# Patient Record
Sex: Female | Born: 1965
Health system: Southern US, Community
[De-identification: ages and names within clinical notes are randomized; demographics above are authoritative.]

## PROBLEM LIST (undated history)

## (undated) DIAGNOSIS — Z98891 History of uterine scar from previous surgery: Secondary | ICD-10-CM

## (undated) DIAGNOSIS — Z9889 Other specified postprocedural states: Secondary | ICD-10-CM

## (undated) DIAGNOSIS — N62 Hypertrophy of breast: Secondary | ICD-10-CM

## (undated) HISTORY — DX: Other specified postprocedural states: Z98.890

## (undated) HISTORY — PX: WISDOM TOOTH EXTRACTION: SHX21

---

## 2004-05-08 ENCOUNTER — Other Ambulatory Visit: Payer: Self-pay

## 2006-10-06 ENCOUNTER — Encounter: Payer: Self-pay | Admitting: Maternal & Fetal Medicine

## 2006-11-24 ENCOUNTER — Encounter: Payer: Self-pay | Admitting: Maternal & Fetal Medicine

## 2007-02-18 ENCOUNTER — Observation Stay: Payer: Self-pay | Admitting: Obstetrics and Gynecology

## 2007-03-19 ENCOUNTER — Observation Stay: Payer: Self-pay | Admitting: Obstetrics and Gynecology

## 2007-03-26 ENCOUNTER — Observation Stay: Payer: Self-pay | Admitting: Obstetrics and Gynecology

## 2007-04-01 ENCOUNTER — Inpatient Hospital Stay: Payer: Self-pay | Admitting: Obstetrics and Gynecology

## 2007-06-02 ENCOUNTER — Ambulatory Visit: Payer: Self-pay | Admitting: Obstetrics and Gynecology

## 2007-08-02 ENCOUNTER — Emergency Department: Payer: Self-pay

## 2008-01-06 IMAGING — US US PELV - US TRANSVAGINAL
1 series · 17 of 25 positions shown · non-contrast
Comparison: none

REASON FOR EXAM: enlarged uterus hydronephrosis lt side
COMMENTS:

[Series 1: us pelv - us transvaginal · 17 of 100 slices shown]
[im 1/100]
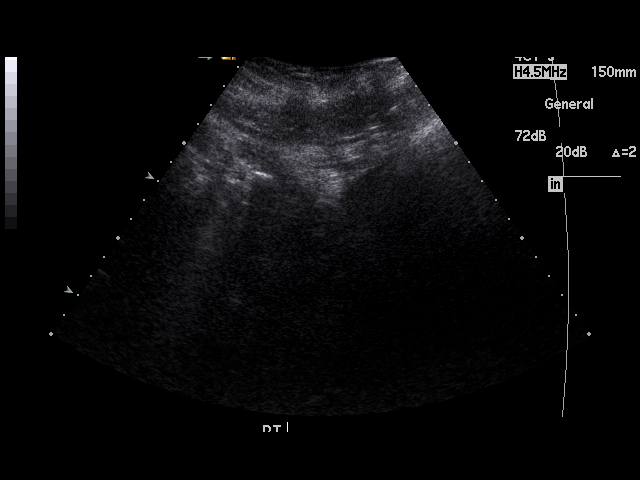
[im 9/100]
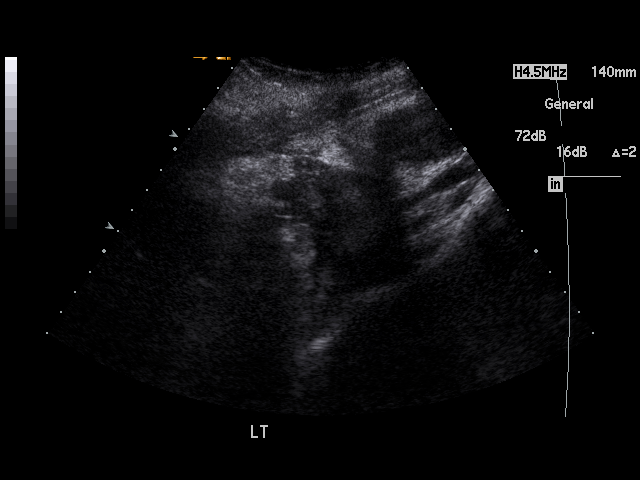
[im 13/100]
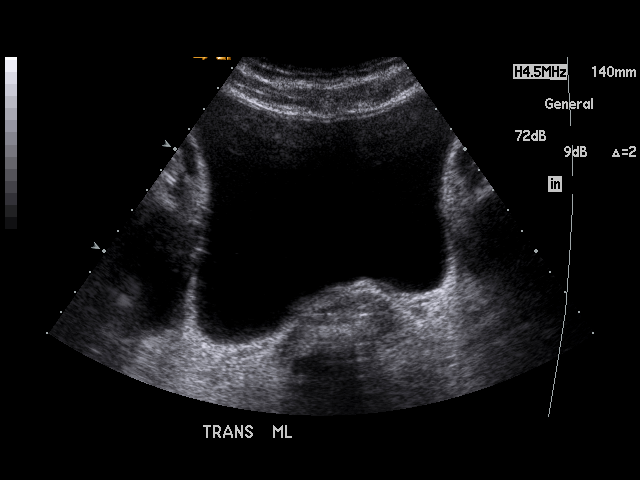
[im 21/100]
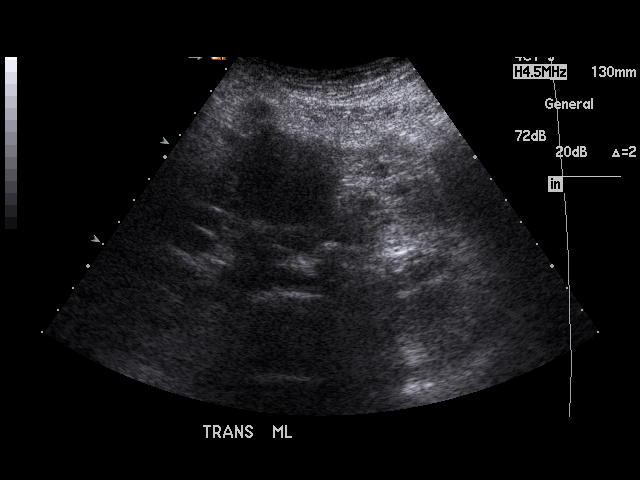
[im 25/100]
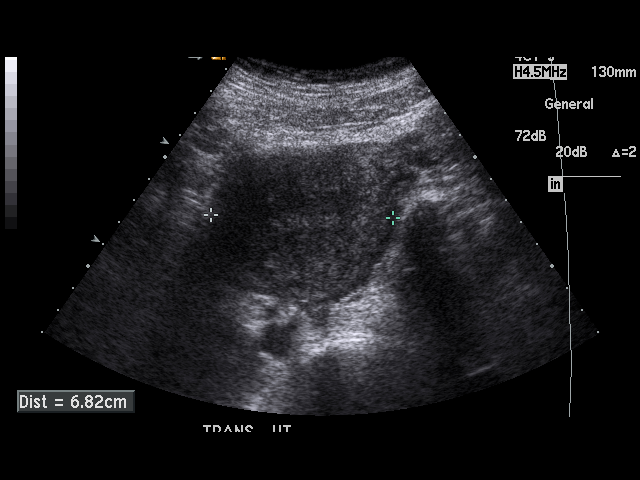
[im 34/100]
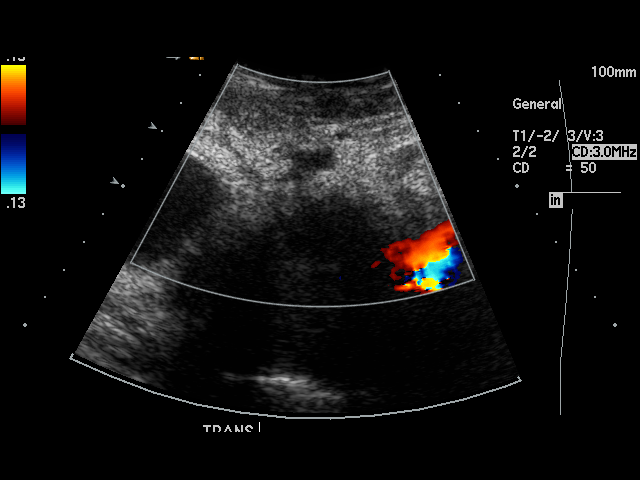
[im 38/100]
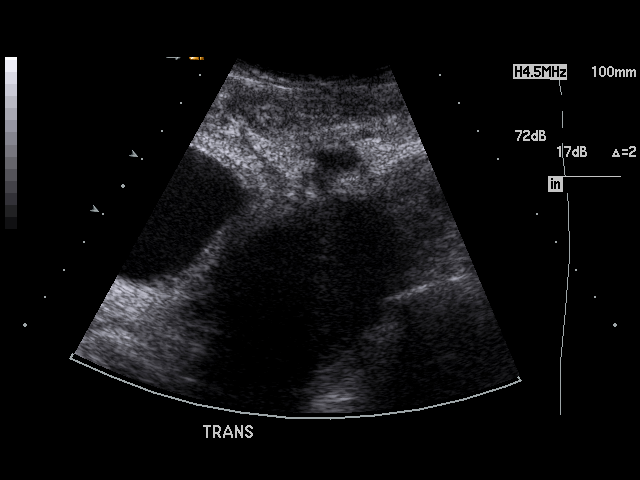
[im 46/100]
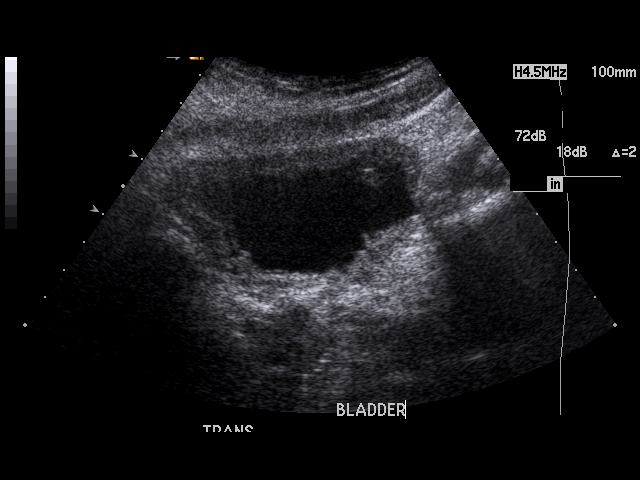
[im 50/100]
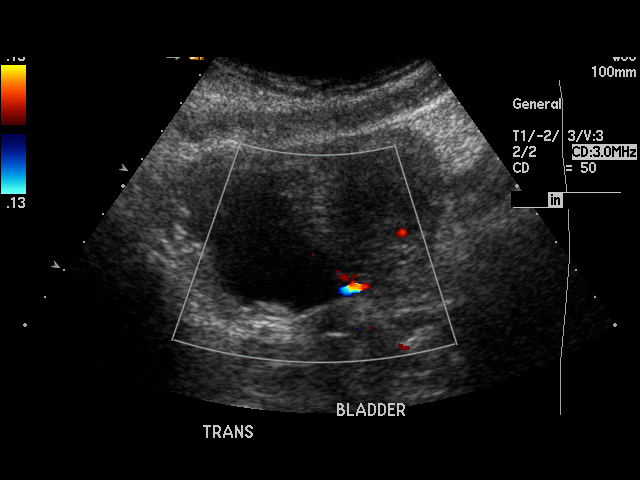
[im 54/100]
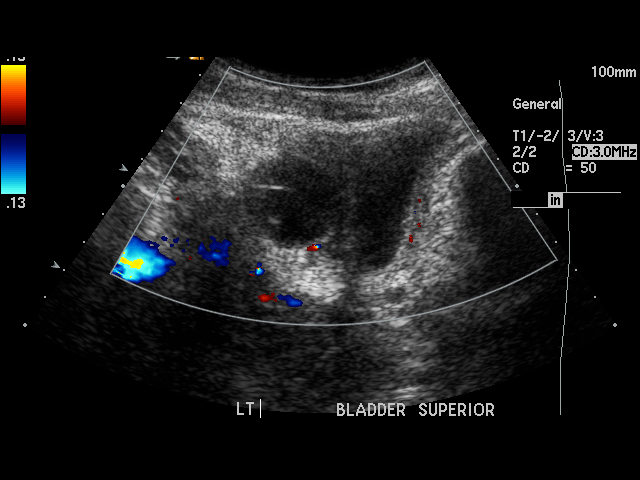
[im 62/100]
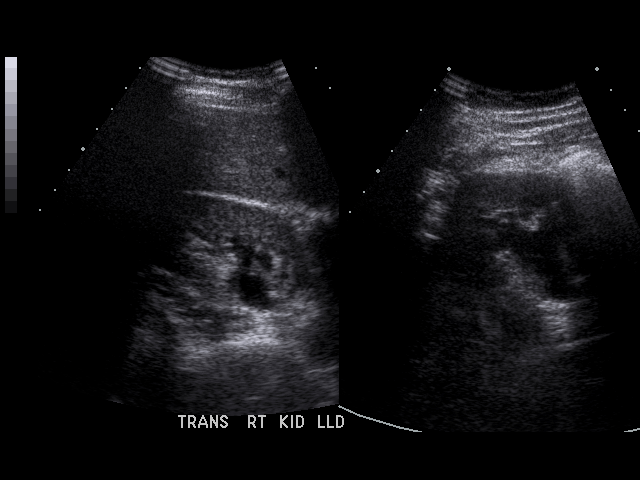
[im 67/100]
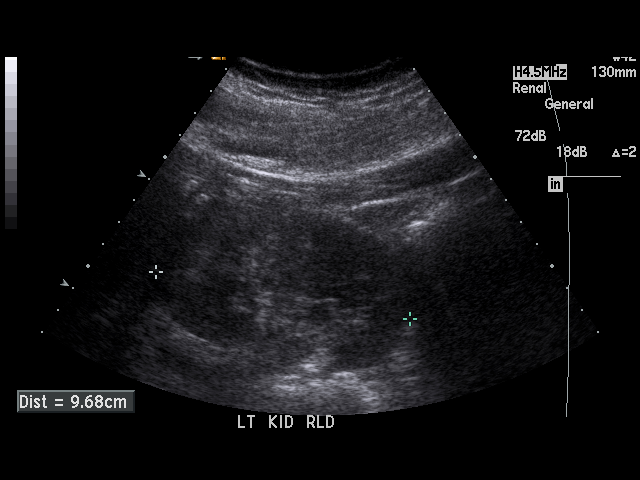
[im 75/100]
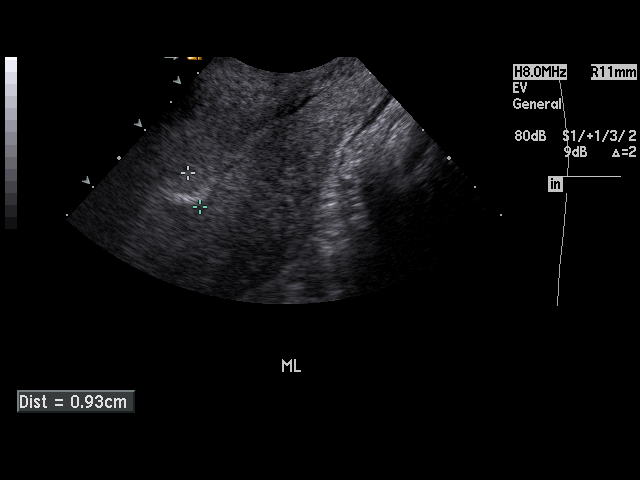
[im 79/100]
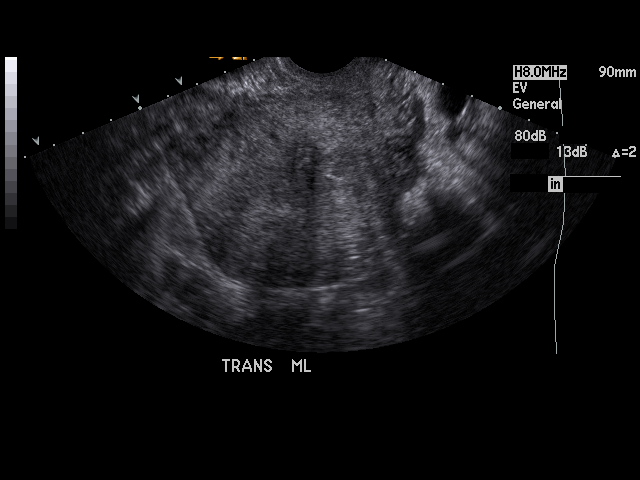
[im 87/100]
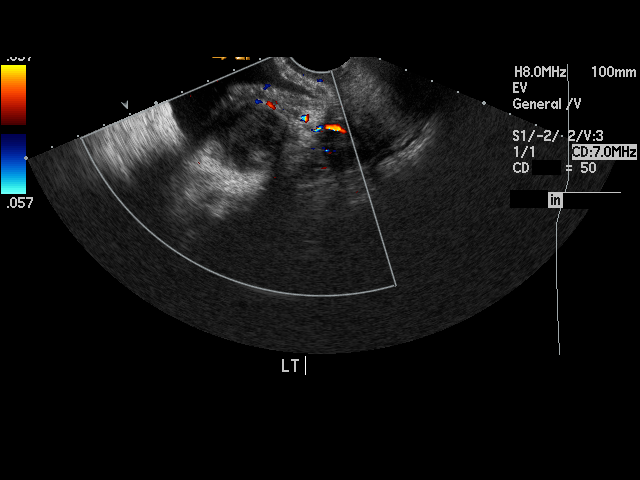
[im 91/100]
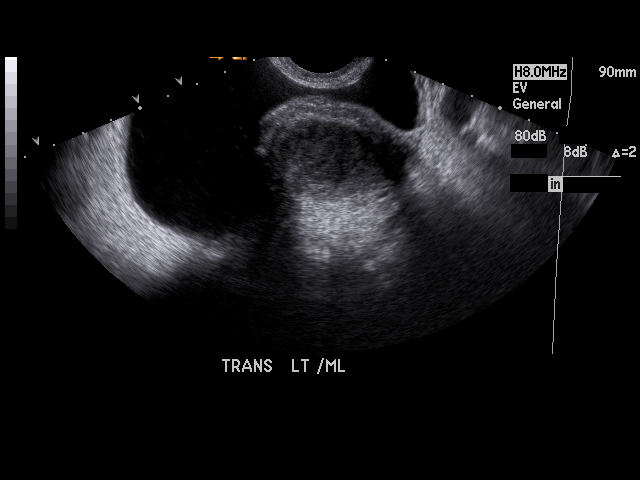
[im 100/100]
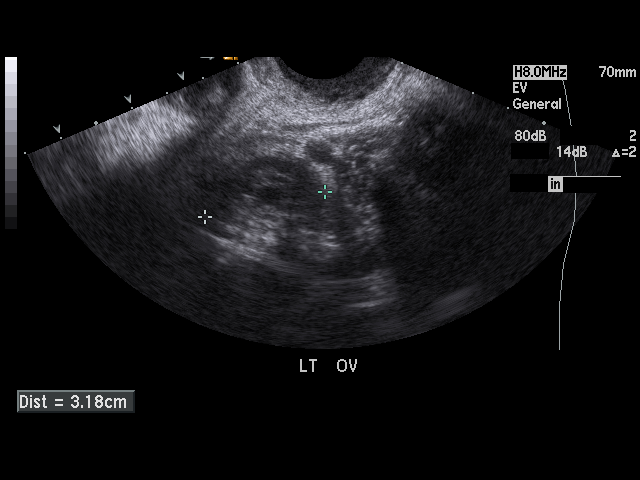

[17 of 25 positions shown; findings below may reference images not displayed]

PROCEDURE:     US  - US PELVIS MASS EXAM  - [DATE]  [DATE] [DATE]  [DATE]

RESULT:     The patient is status post status post C-section in [REDACTED] of this
year. The uterus is plump and is noted to measure 12.79 cm x 5.93 cm x
cm.  The endometrium measures 4.3 mm in thickness. There is an apparent
hypoechoic mass between the uterus and the urinary bladder. This mass
measures approximately 5.1 cm at maximum diameter. The etiology for this is
to me not certain. At residual sealed off hematoma underlying organization
would probably be the first consideration but this is not certain. Continued
follow-up or further evaluation by pelvic MR is recommended. No free fluid
is seen in the pelvis. The RIGHT ovary measures 5.34 cm at maximum diameter
and the LEFT ovary measures 3.43 cm at maximum diameter. There is a 3.82 cm
cyst of the RIGHT ovary.  No free fluid is seen in the pelvis.  The
visualized portion of the urinary bladder is normal in appearance.  Mild
hydronephrosis is observed on the RIGHT.  No hydronephrosis is seen on the
LEFT.
IMPRESSION: 1. There is mass-like density between the uterus and bladder, possibly an
organizing hematoma that measures approximately 5.1 cm at maximum diameter.
Continued follow-up to evaluate for resolution or additional evaluation by
MR is recommended.
2. There is a cyst in the RIGHT ovary.
3. Improving hydronephrosis on the RIGHT. No residual hydronephrosis is seen
on the LEFT.

## 2018-06-09 DIAGNOSIS — L03011 Cellulitis of right finger: Secondary | ICD-10-CM | POA: Diagnosis not present

## 2018-06-15 DIAGNOSIS — L03011 Cellulitis of right finger: Secondary | ICD-10-CM | POA: Diagnosis not present

## 2018-08-20 DIAGNOSIS — R300 Dysuria: Secondary | ICD-10-CM | POA: Diagnosis not present

## 2018-08-20 DIAGNOSIS — N898 Other specified noninflammatory disorders of vagina: Secondary | ICD-10-CM | POA: Diagnosis not present

## 2019-04-07 ENCOUNTER — Encounter: Payer: Self-pay | Admitting: Family Medicine

## 2019-04-07 ENCOUNTER — Other Ambulatory Visit: Payer: Self-pay

## 2019-04-07 ENCOUNTER — Ambulatory Visit (INDEPENDENT_AMBULATORY_CARE_PROVIDER_SITE_OTHER): Payer: 59 | Admitting: Family Medicine

## 2019-04-07 VITALS — BP 128/82 | HR 60 | Temp 98.6°F | Ht 65.16 in | Wt 173.0 lb

## 2019-04-07 DIAGNOSIS — Z1211 Encounter for screening for malignant neoplasm of colon: Secondary | ICD-10-CM

## 2019-04-07 DIAGNOSIS — E663 Overweight: Secondary | ICD-10-CM

## 2019-04-07 DIAGNOSIS — G8929 Other chronic pain: Secondary | ICD-10-CM

## 2019-04-07 DIAGNOSIS — Z7689 Persons encountering health services in other specified circumstances: Secondary | ICD-10-CM

## 2019-04-07 DIAGNOSIS — M25571 Pain in right ankle and joints of right foot: Secondary | ICD-10-CM

## 2019-04-07 DIAGNOSIS — Z1239 Encounter for other screening for malignant neoplasm of breast: Secondary | ICD-10-CM

## 2019-04-07 LAB — COMPREHENSIVE METABOLIC PANEL
ALT: 54 U/L — ABNORMAL HIGH (ref 0–35)
AST: 62 U/L — ABNORMAL HIGH (ref 0–37)
Albumin: 4.4 g/dL (ref 3.5–5.2)
Alkaline Phosphatase: 70 U/L (ref 39–117)
BUN: 11 mg/dL (ref 6–23)
CO2: 28 mEq/L (ref 19–32)
Calcium: 9.3 mg/dL (ref 8.4–10.5)
Chloride: 103 mEq/L (ref 96–112)
Creatinine, Ser: 1 mg/dL (ref 0.40–1.20)
GFR: 70.22 mL/min (ref >60.00)
Glucose, Bld: 87 mg/dL (ref 70–99)
Potassium: 4.1 mEq/L (ref 3.5–5.1)
Sodium: 138 mEq/L (ref 135–145)
Total Bilirubin: 1 mg/dL (ref 0.2–1.2)
Total Protein: 7.7 g/dL (ref 6.0–8.3)

## 2019-04-07 LAB — TSH: TSH: 1.27 u[IU]/mL (ref 0.35–4.50)

## 2019-04-07 LAB — LIPID PANEL
Cholesterol: 141 mg/dL (ref 0–200)
HDL: 47.7 mg/dL (ref >39.00)
LDL Cholesterol: 81 mg/dL (ref 0–99)
NonHDL: 93.59
Total CHOL/HDL Ratio: 3
Triglycerides: 62 mg/dL (ref 0.0–149.0)
VLDL: 12.4 mg/dL (ref 0.0–40.0)

## 2019-04-07 NOTE — Progress Notes (Signed)
Subjective:    Patient ID: Marcia Newman, female    DOB: [birth year], 53 y.o.   MRN: [id number]  HPI This is a 53 yo female who presents today to establish care. 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  Enjoys her job. Has a 29 yo son. Lives with her mother and son. She is divorced.    Has been seeing gyn and getting mammograms from [facility]. Record awaiting mail merge so I am unable to see previous results.   Last CPE- 2019 Mammo- 2018 Pap- 2019, no recent abnormal, had leep in past Colonoscopy- never Tdap- unknown, ? 2008 Flu- never Eye- regular Dental- regular Exercise- off and on, very physical job.  Patient's last menstrual period was 10/01/2007 (approximate).  Weight gain- has gained some weight. Doesn't eat much at night. Eats subs and fries. Drinks soda infrequently, lots of sweet tea daily.   Right ankle pain. Chronic. No known injury. Wears steel toed shoes. Pain at end of day and first thing in morning. Takes Advil occasionally with improvement.   ROS- she denies chest pain, SOB, headache, visual changes, abdominal pain/diarrhea/constipation.   No past medical history on file. Past Surgical History:  Procedure Laterality Date  . NO PAST SURGERIES     Family History  Problem Relation Age of Onset  . Hypertension Paternal Grandmother    Social History   Tobacco Use  . Smoking status: Not on file  Substance Use Topics  . Alcohol use: Not on file  . Drug use: Not on file      Review of Systems Per HPI    Objective:   Physical Exam HENT:     Head: Normocephalic and atraumatic.  Eyes:     Conjunctiva/sclera: Conjunctivae normal.  Cardiovascular:     Rate and Rhythm: Normal rate.  Pulmonary:     Effort: Pulmonary effort is normal.  Musculoskeletal:     Right ankle: She exhibits normal range of motion and no swelling. No tenderness.  Skin:    General: Skin is warm and dry.  Neurological:   Mental Status: She is alert and oriented to person, place, and time.  Psychiatric:        Mood and Affect: Mood normal.        Behavior: Behavior normal.        Thought Content: Thought content normal.        Judgment: Judgment normal.       BP 128/82 (BP Location: Right Arm, Patient Position: Sitting, Cuff Size: Normal)   Pulse 60   Temp 98.6 F (37 C) (Temporal)   Ht 5' 5.16" (1.655 m)   Wt 173 lb (78.5 kg)   LMP 10/01/2007 (Approximate) Comment: stopped having a cycle after mirena placed  SpO2 98%   BMI 28.65 kg/m      Assessment & Plan:  1. Encounter to establish care - Discussed and encouraged healthy lifestyle choices- adequate sleep, regular exercise, stress management and healthy food choices.  - reviewed health maintenance and she needs screening colonoscopy and mammogram, orders placed  2. Screening for breast cancer - MM Digital Screening; Future  3. Screening for colon cancer - Ambulatory referral to Gastroenterology  4. Overweight (BMI 25.0-29.9) - discussed weight gain and her diet, encouraged her to limit fast foods, simple carbs, ensure adequate sleep and stress management - Lipid Panel - Comprehensive metabolic panel - TSH  5. Chronic pain of right ankle - normal  exam, provided some stretching exercises, follow up if no improvement.    Olean Reeeborah Gessner, FNP-BC  [facility]  04/07/2019 12:56 PM

## 2019-04-07 NOTE — Patient Instructions (Signed)
Good to meet you today I have put in an order for a mammogram, please stop at the front Go to the lab.   Plantar Fasciitis Rehab Ask your health care provider which exercises are safe for you. Do exercises exactly as told by your health care provider and adjust them as directed. It is normal to feel mild stretching, pulling, tightness, or discomfort as you do these exercises. Stop right away if you feel sudden pain or your pain gets worse. Do not begin these exercises until told by your health care provider. Stretching and range-of-motion exercises These exercises warm up your muscles and joints and improve the movement and flexibility of your foot. These exercises also help to relieve pain. Plantar fascia stretch  1. Sit with your left / right leg crossed over your opposite knee. 2. Hold your heel with one hand with that thumb near your arch. With your other hand, hold your toes and gently pull them back toward the top of your foot. You should feel a stretch on the bottom of your toes or your foot (plantar fascia) or both. 3. Hold this stretch for__________ seconds. 4. Slowly release your toes and return to the starting position. Repeat __________ times. Complete this exercise __________ times a day. Gastrocnemius stretch, standing This exercise is also called a calf (gastroc) stretch. It stretches the muscles in the back of the upper calf. 1. Stand with your hands against a wall. 2. Extend your left / right leg behind you, and bend your front knee slightly. 3. Keeping your heels on the floor and your back knee straight, shift your weight toward the wall. Do not arch your back. You should feel a gentle stretch in your upper left / right calf. 4. Hold this position for __________ seconds. Repeat __________ times. Complete this exercise __________ times a day. Soleus stretch, standing This exercise is also called a calf (soleus) stretch. It stretches the muscles in the back of the lower calf.  1. Stand with your hands against a wall. 2. Extend your left / right leg behind you, and bend your front knee slightly. 3. Keeping your heels on the floor, bend your back knee and shift your weight slightly over your back leg. You should feel a gentle stretch deep in your lower calf. 4. Hold this position for __________ seconds. Repeat __________ times. Complete this exercise __________ times a day. Gastroc and soleus stretch, standing step This exercise stretches the muscles in the back of the lower leg. These muscles are in the upper calf (gastrocnemius) and the lower calf (soleus). 1. Stand with the ball of your left / right foot on a step. The ball of your foot is on the walking surface, right under your toes. 2. Keep your other foot firmly on the same step. 3. Hold on to the wall or a railing for balance. 4. Slowly lift your other foot, allowing your body weight to press your left / right heel down over the edge of the step. You should feel a stretch in your left / right calf. 5. Hold this position for __________ seconds. 6. Return both feet to the step. 7. Repeat this exercise with a slight bend in your left / right knee. Repeat __________ times with your left / right knee straight and __________ times with your left / right knee bent. Complete this exercise __________ times a day. Balance exercise This exercise builds your balance and strength control of your arch to help take pressure off your plantar  fascia. Single leg stand If this exercise is too easy, you can try it with your eyes closed or while standing on a pillow. 1. Without shoes, stand near a railing or in a doorway. You may hold on to the railing or door frame as needed. 2. Stand on your left / right foot. Keep your big toe down on the floor and try to keep your arch lifted. Do not let your foot roll inward. 3. Hold this position for __________ seconds. Repeat __________ times. Complete this exercise __________ times a day.  This information is not intended to replace advice given to you by your health care provider. Make sure you discuss any questions you have with your health care provider. Document Released: 09/16/2005 Document Revised: 01/07/2019 Document Reviewed: 07/15/2018 Elsevier Patient Education  2020 [employer]

## 2019-04-12 ENCOUNTER — Encounter: Payer: Self-pay | Admitting: Family Medicine

## 2019-05-17 ENCOUNTER — Ambulatory Visit (AMBULATORY_SURGERY_CENTER): Payer: Self-pay | Admitting: *Deleted

## 2019-05-17 ENCOUNTER — Other Ambulatory Visit: Payer: Self-pay

## 2019-05-17 VITALS — Temp 97.0°F | Ht 65.0 in | Wt 170.0 lb

## 2019-05-17 DIAGNOSIS — Z1211 Encounter for screening for malignant neoplasm of colon: Secondary | ICD-10-CM

## 2019-05-17 MED ORDER — SUPREP BOWEL PREP KIT 17.5-3.13-1.6 GM/177ML PO SOLN
1.0000 | Freq: Once | ORAL | 0 refills | Status: AC
Start: 2019-05-17 — End: 2019-05-17

## 2019-05-17 NOTE — Progress Notes (Signed)
Patient denies any allergies to egg or soy products. Patient denies complications with anesthesia/sedation.  Patient denies oxygen use at home and denies diet medications. Emmi instructions for colonoscopy given at [facility] along with Suprep coupon.

## 2019-05-24 ENCOUNTER — Encounter: Payer: Self-pay | Admitting: Gastroenterology

## 2019-05-25 ENCOUNTER — Encounter: Payer: Self-pay | Admitting: Gastroenterology

## 2019-05-28 ENCOUNTER — Telehealth: Payer: Self-pay | Admitting: Gastroenterology

## 2019-05-28 NOTE — Telephone Encounter (Signed)
Covid-19 screening questions °  °  °Do you now or have you had a fever in the last 14 days? °  °Do you have any respiratory symptoms of shortness of breath or cough now or in the last 14 days? °  °Do you have any family members or close contacts with diagnosed or suspected Covid-19 in the past 14 days? °  °Have you been tested for Covid-19 and found to be positive? ° °Please advise pt to check in on the 4th floor and that care partner may wait in the car or come up to the lobby during procedure. Also make aware that both must enter the building wearing a mask. °

## 2019-05-28 NOTE — Telephone Encounter (Signed)
Pt returned call and answered NO to all the Covid-19 screening questions °

## 2019-05-31 ENCOUNTER — Ambulatory Visit (AMBULATORY_SURGERY_CENTER): Payer: 59 | Admitting: Gastroenterology

## 2019-05-31 ENCOUNTER — Other Ambulatory Visit: Payer: Self-pay

## 2019-05-31 ENCOUNTER — Encounter: Payer: Self-pay | Admitting: Gastroenterology

## 2019-05-31 VITALS — BP 144/85 | HR 60 | Temp 96.9°F | Resp 16 | Ht 65.0 in | Wt 170.0 lb

## 2019-05-31 DIAGNOSIS — Z1211 Encounter for screening for malignant neoplasm of colon: Secondary | ICD-10-CM | POA: Diagnosis present

## 2019-05-31 MED ORDER — SODIUM CHLORIDE 0.9 % IV SOLN: 500.0000 mL | Freq: Once | INTRAVENOUS | Status: DC

## 2019-05-31 NOTE — Progress Notes (Signed)
Pt's states no medical or surgical changes since previsit or office visit.  Temp taken by JB VS taken by [place] 

## 2019-05-31 NOTE — Patient Instructions (Signed)
YOU HAD AN ENDOSCOPIC PROCEDURE TODAY AT THE [facility]:   Refer to the procedure report that was given to you for any specific questions about what was found during the examination.  If the procedure report does not answer your questions, please call your gastroenterologist to clarify.  If you requested that your care partner not be given the details of your procedure findings, then the procedure report has been included in a sealed envelope for you to review at your convenience later.  YOU SHOULD EXPECT: Some feelings of bloating in the abdomen. Passage of more gas than usual.  Walking can help get rid of the air that was put into your GI tract during the procedure and reduce the bloating. If you had a lower endoscopy (such as a colonoscopy or flexible sigmoidoscopy) you may notice spotting of blood in your stool or on the toilet paper. If you underwent a bowel prep for your procedure, you may not have a normal bowel movement for a few days.  Please Note:  You might notice some irritation and congestion in your nose or some drainage.  This is from the oxygen used during your procedure.  There is no need for concern and it should clear up in a day or so.  SYMPTOMS TO REPORT IMMEDIATELY:   Following lower endoscopy (colonoscopy or flexible sigmoidoscopy):  Excessive amounts of blood in the stool  Significant tenderness or worsening of abdominal pains  Swelling of the abdomen that is new, acute  Fever of 100F or higher  For urgent or emergent issues, a gastroenterologist can be reached at any hour by calling [phone].   DIET:  We do recommend a small meal at first, but then you may proceed to your regular diet.  Drink plenty of fluids but you should avoid alcoholic beverages for 24 hours.  ACTIVITY:  You should plan to take it easy for the rest of today and you should NOT DRIVE or use heavy machinery until tomorrow (because of the sedation medicines used during the test).     FOLLOW UP: Our staff will call the number listed on your records 48-72 hours following your procedure to check on you and address any questions or concerns that you may have regarding the information given to you following your procedure. If we do not reach you, we will leave a message.  We will attempt to reach you two times.  During this call, we will ask if you have developed any symptoms of COVID 19. If you develop any symptoms (ie: fever, flu-like symptoms, shortness of breath, cough etc.) before then, please call [phone].  If you test positive for Covid 19 in the 2 weeks post procedure, please call and report this information to us.    If any biopsies were taken you will be contacted by phone or by letter within the next 1-3 weeks.  Please call us at [phone] if you have not heard about the biopsies in 3 weeks.    SIGNATURES/CONFIDENTIALITY: You and/or your care partner have signed paperwork which will be entered into your electronic medical record.  These signatures attest to the fact that that the information above on your After Visit Summary has been reviewed and is understood.  Full responsibility of the confidentiality of this discharge information lies with you and/or your care-partner. 

## 2019-05-31 NOTE — Progress Notes (Signed)
A and O x3. Report to RN. Tolerated MAC anesthesia well.

## 2019-05-31 NOTE — Op Note (Signed)
[facility] Patient Name: Marcia Newman Procedure Date: 05/31/2019 7:57 AM MRN: [id number] Endoscopist: Mauri Pole , MD Age: 53 Referring MD:  Date of Birth: 1965-10-21 Gender: Female Account #: [id number] Procedure:                Colonoscopy Indications:              Screening for colorectal malignant neoplasm Medicines:                Monitored Anesthesia Care Procedure:                Pre-Anesthesia Assessment:                           - Prior to the procedure, a History and Physical                            was performed, and patient medications and                            allergies were reviewed. The patient's tolerance of                            previous anesthesia was also reviewed. The risks                            and benefits of the procedure and the sedation                            options and risks were discussed with the patient.                            All questions were answered, and informed consent                            was obtained. Prior Anticoagulants: The patient has                            taken no previous anticoagulant or antiplatelet                            agents. ASA Grade Assessment: II - A patient with                            mild systemic disease. After reviewing the risks                            and benefits, the patient was deemed in                            satisfactory condition to undergo the procedure.                           After obtaining informed consent, the colonoscope  was passed under direct vision. Throughout the                            procedure, the patient's blood pressure, pulse, and                            oxygen saturations were monitored continuously. The                            Colonoscope was introduced through the anus and                            advanced to the the cecum, identified by                            appendiceal orifice and  ileocecal valve. The                            colonoscopy was performed without difficulty. The                            patient tolerated the procedure well. The quality                            of the bowel preparation was excellent. The                            ileocecal valve, appendiceal orifice, and rectum                            were photographed. Scope In: 8:05:37 AM Scope Out: 8:20:25 AM Scope Withdrawal Time: 0 hours 10 minutes 53 seconds  Total Procedure Duration: 0 hours 14 minutes 48 seconds  Findings:                 The perianal and digital rectal examinations were                            normal.                           The colon (entire examined portion) appeared normal. Complications:            No immediate complications. Estimated Blood Loss:     Estimated blood loss: none. Impression:               - The entire examined colon is normal.                           - No specimens collected. Recommendation:           - Patient has a contact number available for                            emergencies. The signs and symptoms of potential  delayed complications were discussed with the                            patient. Return to normal activities tomorrow.                            Written discharge instructions were provided to the                            patient.                           - Resume previous diet.                           - Continue present medications.                           - Repeat colonoscopy in 10 years for screening                            purposes. [name], MD 05/31/2019 8:26:02 AM This report has been signed electronically.

## 2019-06-02 ENCOUNTER — Telehealth: Payer: Self-pay

## 2019-06-02 NOTE — Telephone Encounter (Signed)
No answer, left message to call if having any issues or concerns, B.Dotty Gonzalo RN 

## 2019-06-02 NOTE — Telephone Encounter (Signed)
NO ANSWER, MESSAGE LEFT FOR PATIENT. 

## 2019-10-28 ENCOUNTER — Telehealth: Payer: Self-pay

## 2019-10-28 NOTE — Telephone Encounter (Signed)
Pt called asking about seeing a specialist for possible breast reduction. She has indentions in her shoulders and is having neck pain. She is a DD right now and seems to be getting bigger. Please advise at [phone]

## 2019-10-29 NOTE — Telephone Encounter (Signed)
Marcia Newman,  I spoke with pt and she states she will call and schedule her mammogram since she did not get one in 2020. She states she would like to be referred to a Careers adviser in [place].

## 2019-10-29 NOTE — Telephone Encounter (Signed)
Please call patient and let her know that I am happy to send her to a surgeon to discuss breast reduction. Does she have someone she would like to be referred to? Also, it does not appear that she has had her mammogram. Please ask her about this. I highly recommend she schedule this right away if she hasn't already had it.

## 2019-11-01 ENCOUNTER — Other Ambulatory Visit: Payer: Self-pay | Admitting: Family Medicine

## 2019-11-01 DIAGNOSIS — N6489 Other specified disorders of breast: Secondary | ICD-10-CM

## 2019-11-01 NOTE — Telephone Encounter (Signed)
Pt aware that referral has been placed.  Aware of need to get mammogram scheduled -- patient states that there is a place in [place] that she goes and she will call to schedule this week.   Nothing further needed.

## 2019-11-01 NOTE — Telephone Encounter (Signed)
Please call patient and let her know that I have put in a referral to [facility] in [place]. It is very important that she schedule her mammogram.

## 2019-11-02 ENCOUNTER — Other Ambulatory Visit: Payer: Self-pay | Admitting: Family Medicine

## 2019-11-02 DIAGNOSIS — Z1231 Encounter for screening mammogram for malignant neoplasm of breast: Secondary | ICD-10-CM

## 2019-11-08 ENCOUNTER — Other Ambulatory Visit: Payer: Self-pay | Admitting: Family Medicine

## 2019-11-08 ENCOUNTER — Ambulatory Visit
Admission: RE | Admit: 2019-11-08 | Discharge: 2019-11-08 | Disposition: A | Discharge status: Home / Self Care | Payer: 59 | Source: Ambulatory Visit | Attending: Family Medicine | Admitting: Family Medicine

## 2019-11-08 ENCOUNTER — Encounter (INDEPENDENT_AMBULATORY_CARE_PROVIDER_SITE_OTHER): Payer: Self-pay

## 2019-11-08 ENCOUNTER — Other Ambulatory Visit: Payer: Self-pay

## 2019-11-08 DIAGNOSIS — Z1231 Encounter for screening mammogram for malignant neoplasm of breast: Secondary | ICD-10-CM | POA: Diagnosis present

## 2019-11-08 DIAGNOSIS — N6489 Other specified disorders of breast: Secondary | ICD-10-CM

## 2019-11-16 ENCOUNTER — Inpatient Hospital Stay
Admission: RE | Admit: 2019-11-16 | Discharge: 2019-11-16 | Disposition: A | Discharge status: Home / Self Care | Payer: Self-pay | Source: Ambulatory Visit | Attending: *Deleted | Admitting: *Deleted

## 2019-11-16 ENCOUNTER — Other Ambulatory Visit: Payer: Self-pay | Admitting: *Deleted

## 2019-11-16 DIAGNOSIS — Z1231 Encounter for screening mammogram for malignant neoplasm of breast: Secondary | ICD-10-CM

## 2019-11-24 ENCOUNTER — Encounter: Payer: Self-pay | Admitting: Plastic Surgery

## 2019-11-24 ENCOUNTER — Other Ambulatory Visit: Payer: Self-pay

## 2019-11-24 ENCOUNTER — Ambulatory Visit (INDEPENDENT_AMBULATORY_CARE_PROVIDER_SITE_OTHER): Payer: 59 | Admitting: Plastic Surgery

## 2019-11-24 VITALS — BP 148/82 | HR 69 | Temp 97.1°F | Ht 66.0 in | Wt 173.4 lb

## 2019-11-24 DIAGNOSIS — N62 Hypertrophy of breast: Secondary | ICD-10-CM

## 2019-11-24 NOTE — Progress Notes (Signed)
Referring Provider Elby Beck, [place],  [place] [address]   CC:  Chief Complaint  Patient presents with  . Advice Only    for (B) breast reduction      Marcia Newman is an 54 y.o. female.  HPI: Patient here discuss breast reduction.  She is had back pain neck pain shoulder grooving for years.  She has tried Aleve for an extended period of time with no relief.  She occasionally gets irritation beneath her breast.  She has had a recent mammogram which was negative.  She denies tobacco smoke.  She would like to be a C cup.  She has no previous history of breast biopsies or other procedures.  She has been at a stable weight.  No Known Allergies  Outpatient Encounter Medications as of 11/24/2019  Medication Sig  . Ascorbic Acid (VITAMIN C) 1000 MG tablet Take 1,000 mg by mouth daily.  . Cholecalciferol (VITAMIN D-3) 125 MCG (5000 UT) TABS Take 1 tablet by mouth daily.  Marland Kitchen levonorgestrel (MIRENA) 20 MCG/24HR IUD 1 each by Intrauterine route once.   No facility-administered encounter medications on file as of 11/24/2019.     Past Medical History:  Diagnosis Date  . H/O LEEP   . SVD (spontaneous vaginal delivery)    x 4, 1 stillborn delivery    Past Surgical History:  Procedure Laterality Date  . CESAREAN SECTION  2008   x 1  . NO PAST SURGERIES    . WISDOM TOOTH EXTRACTION      Family History  Problem Relation Age of Onset  . Hypertension Paternal Grandmother   . Colon cancer Neg Hx   . Rectal cancer Neg Hx   . Stomach cancer Neg Hx   . 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    Has one son in the home, grown son in [place]. Two grandchildren. Her mother lives with her.      Review of Systems General: Denies fevers, chills, weight loss CV: Denies chest pain, shortness of breath, palpitations  Physical Exam Vitals with BMI 11/24/2019 05/31/2019  05/31/2019  Height 5\' 6"  - -  Weight 173 lbs 6 oz - -  BMI 28 - -  Systolic [phone]  Diastolic 82 85 70  Pulse 69 60 63    General:  No acute distress,  Alert and oriented, Non-Toxic, Normal speech and affect Breast: She has grade 3 ptosis.  Sternal notch to nipple is 31 cm bilaterally.  Nipple to fold is 15 cm bilaterally.  She has obvious shoulder grooving and 6 signs of skin irritation in the inframammary crease.  Assessment/Plan The patient has bilateral symptomatic macromastia.  She is a good candidate for a breast reduction.  The details of breast reduction surgery were discussed.  I explained the procedure in detail along the with the expected scars.  The risks were discussed in detail and include bleeding, infection, damage to surrounding structures, need for additional procedures, nipple loss, change in nipple sensation, persistent pain, contour irregularities and asymmetries.  I explained that breast feeding is often not possible after breast reduction surgery.  We discussed the expected postoperative course with an overall recovery period of about 1 month.  She demonstrated full understanding of all risks.    I anticipate approximately 750 g of tissue removed from each side.   Marcia Newman  Wendy Poet 11/24/2019, 10:49 AM

## 2019-12-13 ENCOUNTER — Other Ambulatory Visit: Payer: Self-pay

## 2019-12-13 ENCOUNTER — Encounter (HOSPITAL_BASED_OUTPATIENT_CLINIC_OR_DEPARTMENT_OTHER): Payer: Self-pay | Admitting: Plastic Surgery

## 2019-12-16 ENCOUNTER — Ambulatory Visit (INDEPENDENT_AMBULATORY_CARE_PROVIDER_SITE_OTHER): Payer: 59 | Admitting: Surgical

## 2019-12-16 ENCOUNTER — Other Ambulatory Visit (HOSPITAL_COMMUNITY)
Admission: RE | Admit: 2019-12-16 | Discharge: 2019-12-16 | Disposition: A | Discharge status: Home / Self Care | Payer: 59 | Source: Ambulatory Visit | Attending: Plastic Surgery | Admitting: Plastic Surgery

## 2019-12-16 ENCOUNTER — Encounter: Payer: Self-pay | Admitting: Surgical

## 2019-12-16 ENCOUNTER — Other Ambulatory Visit: Payer: Self-pay

## 2019-12-16 VITALS — BP 118/72 | HR 69 | Temp 97.7°F | Ht 66.0 in | Wt 175.2 lb

## 2019-12-16 DIAGNOSIS — Z01812 Encounter for preprocedural laboratory examination: Secondary | ICD-10-CM | POA: Diagnosis present

## 2019-12-16 DIAGNOSIS — Z20822 Contact with and (suspected) exposure to covid-19: Secondary | ICD-10-CM | POA: Diagnosis not present

## 2019-12-16 DIAGNOSIS — N62 Hypertrophy of breast: Secondary | ICD-10-CM

## 2019-12-16 LAB — SARS CORONAVIRUS 2 (TAT 6-24 HRS): SARS Coronavirus 2: NEGATIVE

## 2019-12-16 MED ORDER — HYDROCODONE-ACETAMINOPHEN 5-325 MG PO TABS: 1.0000 | ORAL_TABLET | Freq: Four times a day (QID) | ORAL | 0 refills | Status: AC | PRN

## 2019-12-16 MED ORDER — ONDANSETRON HCL 4 MG PO TABS: 4.0000 mg | ORAL_TABLET | Freq: Three times a day (TID) | ORAL | 0 refills | Status: DC | PRN

## 2019-12-16 NOTE — Progress Notes (Signed)
Patient ID: Marcia Newman, female    DOB: Oct 04, 1965, 54 y.o.   MRN: [id number]  Chief Complaint  Patient presents with  . Pre-op Exam    SX: BL breast reduction      ICD-10-CM   1. Macromastia  N62     History of Present Illness: Marcia Newman is a 54 y.o.  female  with a history of mammary hyperplasia.  She presents for preoperative evaluation for upcoming procedure, bilateral breast reduction, scheduled for 12/20/19 with Dr. Claudia Desanctis.  Estimated removal of excess breast tissue 750 g on each side.  She would like to be a C cup.  She would like to be smaller than expected upon waking up.  No history of anesthesia, no family history of adverse reactions to anesthesia. No history of DVT/PE.  No family history of DVT/PE.  No family history of any bleeding disorders/clotting disorder. No history of MI/CVA Generally healthy, no history of hypertension, diabetes. No recent colds or illnesses.  Patient currently has Mirena Levonorgestrel IUD. No known allergies Hx of C-section in 2008. Mammogram on 11/08/19 - Negative Not on any blood thinners    Past Medical History: Allergies: No Known Allergies  Current Medications:  Current Outpatient Medications:  .  Ascorbic Acid (VITAMIN C) 1000 MG tablet, Take 1,000 mg by mouth daily., Disp: , Rfl:  .  Cholecalciferol (VITAMIN D-3) 125 MCG (5000 UT) TABS, Take 1 tablet by mouth daily., Disp: , Rfl:  .  levonorgestrel (MIRENA) 20 MCG/24HR IUD, 1 each by Intrauterine route once., Disp: , Rfl:   Past Medical Problems: Past Medical History:  Diagnosis Date  . H/O LEEP   . History of C-section   . Macromastia    bil  . SVD (spontaneous vaginal delivery)    x 4, 1 stillborn delivery    Past Surgical History: Past Surgical History:  Procedure Laterality Date  . CESAREAN SECTION  2008   x 1  . WISDOM TOOTH EXTRACTION      Social History: Social History   Socioeconomic History  . Marital status: Divorced    Spouse name: Not  on file  . Number of children: Not on file  . Years of education: Not on file  . Highest education level: Not on file  Occupational History  . Not on file  Tobacco Use  . Smoking status: Never Smoker  . Smokeless tobacco: Never Used  Substance and Sexual Activity  . Alcohol use: Yes    Comment: occasional  . Drug use: Never  . Sexual activity: Yes    Birth control/protection: I.U.D.    Comment: Mirena IUD  Other Topics Concern  . 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    Has one son in the home, grown son in [place]. Two grandchildren. Her mother lives with her.    Social Determinants of Health   Financial Resource Strain:   . Difficulty of Paying Living Expenses:   Food Insecurity:   . Worried About Charity fundraiser in the Last Year:   . Arboriculturist in the Last Year:   Transportation Needs:   . Film/video editor (Medical):   Marland Kitchen Lack of Transportation (Non-Medical):   Physical Activity:   . Days of Exercise per Week:   . Minutes of Exercise per Session:   Stress:   . Feeling of Stress :   Social Connections:   . Frequency  of Communication with Friends and Family:   . Frequency of Social Gatherings with Friends and Family:   . Attends Religious Services:   . Active Member of Clubs or Organizations:   . Attends Banker Meetings:   Marland Kitchen Marital Status:   Intimate Partner Violence:   . Fear of Current or Ex-Partner:   . Emotionally Abused:   Marland Kitchen Physically Abused:   . Sexually Abused:     Family History: Family History  Problem Relation Age of Onset  . Hypertension Paternal Grandmother   . Colon cancer Neg Hx   . Rectal cancer Neg Hx   . Stomach cancer Neg Hx   . Breast cancer Neg Hx     Review of Systems: Review of Systems  Constitutional: Negative for chills, fever and malaise/fatigue.  Respiratory: Negative.   Cardiovascular: Negative.   Gastrointestinal: Negative.   Skin:  Negative.   Neurological: Negative for dizziness and weakness.    Physical Exam: Vital Signs BP 118/72 (BP Location: Left Arm, Patient Position: Sitting, Cuff Size: Normal)   Pulse 69   Temp 97.7 F (36.5 C) (Temporal)   Ht 5\' 6"  (1.676 m)   Wt 175 lb 3.2 oz (79.5 kg)   SpO2 100%   BMI 28.28 kg/m  Physical Exam Constitutional:      General: She is not in acute distress.    Appearance: Normal appearance. She is not ill-appearing.  HENT:     Head: Normocephalic and atraumatic.  Eyes:     Pupils: Pupils are equal, round Neck:     Musculoskeletal: Normal range of motion.  Cardiovascular:     Rate and Rhythm: Normal rate and regular rhythm.     Pulses: Normal pulses.     Heart sounds: Normal heart sounds. No murmur.  Pulmonary:     Effort: Pulmonary effort is normal. No respiratory distress.     Breath sounds: Normal breath sounds. No wheezing.  Abdominal:     General: Abdomen is flat. There is no distension.     Palpations: Abdomen is soft.     Tenderness: There is no abdominal tenderness.  Musculoskeletal: Normal range of motion.  Skin:    General: Skin is warm and dry.     Findings: No erythema or rash.  Neurological:     General: No focal deficit present.     Mental Status: She is alert and oriented to person, place, and time. Mental status is at baseline.     Motor: No weakness.  Psychiatric:        Mood and Affect: Mood normal.        Behavior: Behavior normal.     Assessment/Plan: The patient is scheduled for bilateral breast reduction with Dr on 12/20/2019.    Prescription sent to pharmacy for postop pain, nausea/vomiting.  Mammogram: Negative mammogram 11/08/2019 Smoking status: Non-smoker Caprini score: 4, moderate, recommend mechanical prophylaxis with SCDs and early ambulation.  The risk that can be encountered with breast reduction were discussed and include the following but not limited to these:  Breast asymmetry, fluid accumulation, firmness of  the breast, inability to breast feed, loss of nipple or areola, skin loss, decrease or no nipple sensation, fat necrosis of the breast tissue, bleeding, infection, healing delay.  There are risks of anesthesia, changes to skin sensation and injury to nerves or blood vessels.  The muscle can be temporarily or permanently injured.  You may have an allergic reaction to tape, suture, glue, blood products which can  result in skin discoloration, swelling, pain, skin lesions, poor healing.  Any of these can lead to the need for revisonal surgery or stage procedures.  A reduction has potential to interfere with diagnostic procedures.  Nipple or breast piercing can increase risks of infection.  This procedure is best done when the breast is fully developed.  Changes in the breast will continue to occur over time.  Pregnancy can alter the outcomes of previous breast reduction surgery, weight gain and weigh loss can also effect the long term appearance.   Patient was provided with consent forms for general surgical risks and bilateral breast reduction risks.  Patient had adequate time to read through both forms prior to and after our appointment today.  We covered the general surgical risks, including but not limited to bleeding, infection, damage to surrounding structures, risks of anesthesia, risks of DVT/PE, risks of wounds/major wound separation.  We also covered the risks associated with bilateral breast reduction surgery, including nipple areola necrosis, sensation change of skin and nipple areolar complex, wounds, breast-feeding, long-term results.  Patient signed consent form will be scanned into chart.  Covid test scheduled.  Recommend calling with any questions or concerns prior to surgery.  Electronically signed by: Kermit Balo Eligah Anello, PA-C 12/16/2019 8:52 AM

## 2019-12-16 NOTE — H&P (View-Only) (Signed)
Patient ID: Marcia Newman, female    DOB: Oct 04, 1965, 54 y.o.   MRN: [id number]  Chief Complaint  Patient presents with  . Pre-op Exam    SX: BL breast reduction      ICD-10-CM   1. Macromastia  N62     History of Present Illness: Marcia Newman is a 54 y.o.  female  with a history of mammary hyperplasia.  She presents for preoperative evaluation for upcoming procedure, bilateral breast reduction, scheduled for 12/20/19 with Dr. Claudia Desanctis.  Estimated removal of excess breast tissue 750 g on each side.  She would like to be a C cup.  She would like to be smaller than expected upon waking up.  No history of anesthesia, no family history of adverse reactions to anesthesia. No history of DVT/PE.  No family history of DVT/PE.  No family history of any bleeding disorders/clotting disorder. No history of MI/CVA Generally healthy, no history of hypertension, diabetes. No recent colds or illnesses.  Patient currently has Mirena Levonorgestrel IUD. No known allergies Hx of C-section in 2008. Mammogram on 11/08/19 - Negative Not on any blood thinners    Past Medical History: Allergies: No Known Allergies  Current Medications:  Current Outpatient Medications:  .  Ascorbic Acid (VITAMIN C) 1000 MG tablet, Take 1,000 mg by mouth daily., Disp: , Rfl:  .  Cholecalciferol (VITAMIN D-3) 125 MCG (5000 UT) TABS, Take 1 tablet by mouth daily., Disp: , Rfl:  .  levonorgestrel (MIRENA) 20 MCG/24HR IUD, 1 each by Intrauterine route once., Disp: , Rfl:   Past Medical Problems: Past Medical History:  Diagnosis Date  . H/O LEEP   . History of C-section   . Macromastia    bil  . SVD (spontaneous vaginal delivery)    x 4, 1 stillborn delivery    Past Surgical History: Past Surgical History:  Procedure Laterality Date  . CESAREAN SECTION  2008   x 1  . WISDOM TOOTH EXTRACTION      Social History: Social History   Socioeconomic History  . Marital status: Divorced    Spouse name: Not  on file  . Number of children: Not on file  . Years of education: Not on file  . Highest education level: Not on file  Occupational History  . Not on file  Tobacco Use  . Smoking status: Never Smoker  . Smokeless tobacco: Never Used  Substance and Sexual Activity  . Alcohol use: Yes    Comment: occasional  . Drug use: Never  . Sexual activity: Yes    Birth control/protection: I.U.D.    Comment: Mirena IUD  Other Topics Concern  . 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    Has one son in the home, grown son in [place]. Two grandchildren. Her mother lives with her.    Social Determinants of Health   Financial Resource Strain:   . Difficulty of Paying Living Expenses:   Food Insecurity:   . Worried About Charity fundraiser in the Last Year:   . Arboriculturist in the Last Year:   Transportation Needs:   . Film/video editor (Medical):   Marland Kitchen Lack of Transportation (Non-Medical):   Physical Activity:   . Days of Exercise per Week:   . Minutes of Exercise per Session:   Stress:   . Feeling of Stress :   Social Connections:   . Frequency  of Communication with Friends and Family:   . Frequency of Social Gatherings with Friends and Family:   . Attends Religious Services:   . Active Member of Clubs or Organizations:   . Attends Banker Meetings:   Marland Kitchen Marital Status:   Intimate Partner Violence:   . Fear of Current or Ex-Partner:   . Emotionally Abused:   Marland Kitchen Physically Abused:   . Sexually Abused:     Family History: Family History  Problem Relation Age of Onset  . Hypertension Paternal Grandmother   . Colon cancer Neg Hx   . Rectal cancer Neg Hx   . Stomach cancer Neg Hx   . Breast cancer Neg Hx     Review of Systems: Review of Systems  Constitutional: Negative for chills, fever and malaise/fatigue.  Respiratory: Negative.   Cardiovascular: Negative.   Gastrointestinal: Negative.   Skin:  Negative.   Neurological: Negative for dizziness and weakness.    Physical Exam: Vital Signs BP 118/72 (BP Location: Left Arm, Patient Position: Sitting, Cuff Size: Normal)   Pulse 69   Temp 97.7 F (36.5 C) (Temporal)   Ht 5\' 6"  (1.676 m)   Wt 175 lb 3.2 oz (79.5 kg)   SpO2 100%   BMI 28.28 kg/m  Physical Exam Constitutional:      General: She is not in acute distress.    Appearance: Normal appearance. She is not ill-appearing.  HENT:     Head: Normocephalic and atraumatic.  Eyes:     Pupils: Pupils are equal, round Neck:     Musculoskeletal: Normal range of motion.  Cardiovascular:     Rate and Rhythm: Normal rate and regular rhythm.     Pulses: Normal pulses.     Heart sounds: Normal heart sounds. No murmur.  Pulmonary:     Effort: Pulmonary effort is normal. No respiratory distress.     Breath sounds: Normal breath sounds. No wheezing.  Abdominal:     General: Abdomen is flat. There is no distension.     Palpations: Abdomen is soft.     Tenderness: There is no abdominal tenderness.  Musculoskeletal: Normal range of motion.  Skin:    General: Skin is warm and dry.     Findings: No erythema or rash.  Neurological:     General: No focal deficit present.     Mental Status: She is alert and oriented to person, place, and time. Mental status is at baseline.     Motor: No weakness.  Psychiatric:        Mood and Affect: Mood normal.        Behavior: Behavior normal.     Assessment/Plan: The patient is scheduled for bilateral breast reduction with Dr on 12/20/2019.    Prescription sent to pharmacy for postop pain, nausea/vomiting.  Mammogram: Negative mammogram 11/08/2019 Smoking status: Non-smoker Caprini score: 4, moderate, recommend mechanical prophylaxis with SCDs and early ambulation.  The risk that can be encountered with breast reduction were discussed and include the following but not limited to these:  Breast asymmetry, fluid accumulation, firmness of  the breast, inability to breast feed, loss of nipple or areola, skin loss, decrease or no nipple sensation, fat necrosis of the breast tissue, bleeding, infection, healing delay.  There are risks of anesthesia, changes to skin sensation and injury to nerves or blood vessels.  The muscle can be temporarily or permanently injured.  You may have an allergic reaction to tape, suture, glue, blood products which can  result in skin discoloration, swelling, pain, skin lesions, poor healing.  Any of these can lead to the need for revisonal surgery or stage procedures.  A reduction has potential to interfere with diagnostic procedures.  Nipple or breast piercing can increase risks of infection.  This procedure is best done when the breast is fully developed.  Changes in the breast will continue to occur over time.  Pregnancy can alter the outcomes of previous breast reduction surgery, weight gain and weigh loss can also effect the long term appearance.   Patient was provided with consent forms for general surgical risks and bilateral breast reduction risks.  Patient had adequate time to read through both forms prior to and after our appointment today.  We covered the general surgical risks, including but not limited to bleeding, infection, damage to surrounding structures, risks of anesthesia, risks of DVT/PE, risks of wounds/major wound separation.  We also covered the risks associated with bilateral breast reduction surgery, including nipple areola necrosis, sensation change of skin and nipple areolar complex, wounds, breast-feeding, long-term results.  Patient signed consent form will be scanned into chart.  Covid test scheduled.  Recommend calling with any questions or concerns prior to surgery.  Electronically signed by: Mohan Erven J Naithen Rivenburg, PA-C 12/16/2019 8:52 AM 

## 2019-12-20 ENCOUNTER — Other Ambulatory Visit: Payer: Self-pay

## 2019-12-20 ENCOUNTER — Encounter (HOSPITAL_BASED_OUTPATIENT_CLINIC_OR_DEPARTMENT_OTHER): Payer: Self-pay | Admitting: Plastic Surgery

## 2019-12-20 ENCOUNTER — Ambulatory Visit (HOSPITAL_BASED_OUTPATIENT_CLINIC_OR_DEPARTMENT_OTHER)
Admission: RE | Admit: 2019-12-20 | Discharge: 2019-12-20 | Disposition: A | Discharge status: Home / Self Care | Payer: 59 | Attending: Plastic Surgery | Admitting: Plastic Surgery

## 2019-12-20 ENCOUNTER — Encounter (HOSPITAL_BASED_OUTPATIENT_CLINIC_OR_DEPARTMENT_OTHER)
Admission: RE | Disposition: A | Discharge status: Home / Self Care | Payer: Self-pay | Source: Home / Self Care | Attending: Plastic Surgery

## 2019-12-20 ENCOUNTER — Ambulatory Visit (HOSPITAL_BASED_OUTPATIENT_CLINIC_OR_DEPARTMENT_OTHER): Payer: 59 | Admitting: Anesthesiology

## 2019-12-20 DIAGNOSIS — N62 Hypertrophy of breast: Secondary | ICD-10-CM

## 2019-12-20 DIAGNOSIS — Z975 Presence of (intrauterine) contraceptive device: Secondary | ICD-10-CM | POA: Diagnosis not present

## 2019-12-20 HISTORY — DX: Hypertrophy of breast: N62

## 2019-12-20 HISTORY — DX: History of uterine scar from previous surgery: Z98.891

## 2019-12-20 HISTORY — PX: BREAST REDUCTION SURGERY: SHX8

## 2019-12-20 SURGERY — MAMMOPLASTY, REDUCTION
Anesthesia: General | Site: Breast | Laterality: Bilateral

## 2019-12-20 MED ORDER — ONDANSETRON HCL 4 MG/2ML IJ SOLN
INTRAMUSCULAR | Status: DC | PRN
  Administered 2019-12-20: 4 mg via INTRAVENOUS

## 2019-12-20 MED ORDER — CEFAZOLIN SODIUM-DEXTROSE 2-4 GM/100ML-% IV SOLN
INTRAVENOUS | Status: AC
  Filled 2019-12-20: qty 100

## 2019-12-20 MED ORDER — PROPOFOL 10 MG/ML IV BOLUS
INTRAVENOUS | Status: DC | PRN
  Administered 2019-12-20: 150 mg via INTRAVENOUS

## 2019-12-20 MED ORDER — FENTANYL CITRATE (PF) 100 MCG/2ML IJ SOLN
INTRAMUSCULAR | Status: DC | PRN
  Administered 2019-12-20: 25 ug via INTRAVENOUS
  Administered 2019-12-20: 100 ug via INTRAVENOUS
  Administered 2019-12-20: 25 ug via INTRAVENOUS

## 2019-12-20 MED ORDER — MIDAZOLAM HCL 5 MG/5ML IJ SOLN
INTRAMUSCULAR | Status: DC | PRN
  Administered 2019-12-20: 2 mg via INTRAVENOUS

## 2019-12-20 MED ORDER — DEXAMETHASONE SODIUM PHOSPHATE 4 MG/ML IJ SOLN
INTRAMUSCULAR | Status: DC | PRN
  Administered 2019-12-20: 10 mg via INTRAVENOUS

## 2019-12-20 MED ORDER — PROPOFOL 10 MG/ML IV BOLUS
INTRAVENOUS | Status: AC
  Filled 2019-12-20: qty 20

## 2019-12-20 MED ORDER — EPINEPHRINE PF 1 MG/ML IJ SOLN
INTRAMUSCULAR | Status: AC
  Filled 2019-12-20: qty 1

## 2019-12-20 MED ORDER — BUPIVACAINE HCL (PF) 0.25 % IJ SOLN
INTRAMUSCULAR | Status: AC
  Filled 2019-12-20: qty 30

## 2019-12-20 MED ORDER — HYDROMORPHONE HCL 1 MG/ML IJ SOLN
INTRAMUSCULAR | Status: AC
  Filled 2019-12-20: qty 0.5

## 2019-12-20 MED ORDER — OXYCODONE HCL 5 MG/5ML PO SOLN: 5.0000 mg | Freq: Once | ORAL | Status: DC | PRN

## 2019-12-20 MED ORDER — FENTANYL CITRATE (PF) 100 MCG/2ML IJ SOLN
INTRAMUSCULAR | Status: AC
  Filled 2019-12-20: qty 2

## 2019-12-20 MED ORDER — PROMETHAZINE HCL 25 MG/ML IJ SOLN: 6.2500 mg | INTRAMUSCULAR | Status: DC | PRN

## 2019-12-20 MED ORDER — DEXAMETHASONE SODIUM PHOSPHATE 10 MG/ML IJ SOLN
INTRAMUSCULAR | Status: AC
  Filled 2019-12-20: qty 1

## 2019-12-20 MED ORDER — LIDOCAINE HCL (CARDIAC) PF 100 MG/5ML IV SOSY
PREFILLED_SYRINGE | INTRAVENOUS | Status: DC | PRN
  Administered 2019-12-20: 75 mg via INTRAVENOUS

## 2019-12-20 MED ORDER — MEPERIDINE HCL 25 MG/ML IJ SOLN: 6.2500 mg | INTRAMUSCULAR | Status: DC | PRN

## 2019-12-20 MED ORDER — FENTANYL CITRATE (PF) 100 MCG/2ML IJ SOLN: 50.0000 ug | INTRAMUSCULAR | Status: DC | PRN

## 2019-12-20 MED ORDER — EPHEDRINE SULFATE 50 MG/ML IJ SOLN
INTRAMUSCULAR | Status: DC | PRN
  Administered 2019-12-20 (×4): 10 mg via INTRAVENOUS

## 2019-12-20 MED ORDER — LIDOCAINE 2% (20 MG/ML) 5 ML SYRINGE
INTRAMUSCULAR | Status: AC
  Filled 2019-12-20: qty 5

## 2019-12-20 MED ORDER — MIDAZOLAM HCL 2 MG/2ML IJ SOLN
INTRAMUSCULAR | Status: AC
  Filled 2019-12-20: qty 2

## 2019-12-20 MED ORDER — EPHEDRINE 5 MG/ML INJ
INTRAVENOUS | Status: AC
  Filled 2019-12-20: qty 20

## 2019-12-20 MED ORDER — LACTATED RINGERS IV SOLN: INTRAVENOUS | Status: DC

## 2019-12-20 MED ORDER — HYDROMORPHONE HCL 1 MG/ML IJ SOLN
0.2500 mg | INTRAMUSCULAR | Status: DC | PRN
  Administered 2019-12-20 (×2): 0.25 mg via INTRAVENOUS
  Administered 2019-12-20: 0.5 mg via INTRAVENOUS

## 2019-12-20 MED ORDER — CEFAZOLIN SODIUM-DEXTROSE 2-4 GM/100ML-% IV SOLN
2.0000 g | INTRAVENOUS | Status: AC
  Administered 2019-12-20: 2 g via INTRAVENOUS

## 2019-12-20 MED ORDER — MIDAZOLAM HCL 2 MG/2ML IJ SOLN: 1.0000 mg | INTRAMUSCULAR | Status: DC | PRN

## 2019-12-20 MED ORDER — OXYCODONE HCL 5 MG PO TABS: 5.0000 mg | ORAL_TABLET | Freq: Once | ORAL | Status: DC | PRN

## 2019-12-20 MED ORDER — LACTATED RINGERS IV SOLN
INTRAVENOUS | Status: DC | PRN
  Administered 2019-12-20: 600 mL

## 2019-12-20 MED ORDER — ONDANSETRON HCL 4 MG/2ML IJ SOLN
INTRAMUSCULAR | Status: AC
  Filled 2019-12-20: qty 2

## 2019-12-20 SURGICAL SUPPLY — 68 items
BAG DECANTER FOR FLEXI CONT (MISCELLANEOUS) ×3 IMPLANT
BENZOIN TINCTURE PRP APPL 2/3 (GAUZE/BANDAGES/DRESSINGS) ×6 IMPLANT
BLADE SURG 10 STRL SS (BLADE) ×6 IMPLANT
BLADE SURG 15 STRL LF DISP TIS (BLADE) ×1 IMPLANT
BLADE SURG 15 STRL SS (BLADE) ×3
BNDG ELASTIC 6X5.8 VLCR STR LF (GAUZE/BANDAGES/DRESSINGS) ×6 IMPLANT
BNDG GAUZE ELAST 4 BULKY (GAUZE/BANDAGES/DRESSINGS) ×6 IMPLANT
CANISTER SUCT 1200ML W/VALVE (MISCELLANEOUS) ×3 IMPLANT
CHLORAPREP W/TINT 26 (MISCELLANEOUS) ×6 IMPLANT
CLIP VESOCCLUDE MED 6/CT (CLIP) IMPLANT
COVER BACK TABLE 60X90IN (DRAPES) ×3 IMPLANT
COVER MAYO STAND STRL (DRAPES) ×3 IMPLANT
COVER WAND RF STERILE (DRAPES) IMPLANT
DECANTER SPIKE VIAL GLASS SM (MISCELLANEOUS) IMPLANT
DRAIN CHANNEL 15F RND FF W/TCR (WOUND CARE) IMPLANT
DRAPE LAPAROSCOPIC ABDOMINAL (DRAPES) ×3 IMPLANT
DRAPE UTILITY XL STRL (DRAPES) ×3 IMPLANT
DRSG PAD ABDOMINAL 8X10 ST (GAUZE/BANDAGES/DRESSINGS) ×12 IMPLANT
ELECT REM PT RETURN 9FT ADLT (ELECTROSURGICAL) ×3
ELECTRODE REM PT RTRN 9FT ADLT (ELECTROSURGICAL) ×1 IMPLANT
EVACUATOR SILICONE 100CC (DRAIN) IMPLANT
GAUZE SPONGE 4X4 12PLY STRL (GAUZE/BANDAGES/DRESSINGS) IMPLANT
GLOVE BIO SURGEON STRL SZ 6.5 (GLOVE) IMPLANT
GLOVE BIO SURGEON STRL SZ7 (GLOVE) ×9 IMPLANT
GLOVE BIO SURGEON STRL SZ7.5 (GLOVE) IMPLANT
GLOVE BIO SURGEONS STRL SZ 6.5 (GLOVE)
GLOVE BIOGEL M STRL SZ7.5 (GLOVE) ×6 IMPLANT
GLOVE BIOGEL PI IND STRL 8 (GLOVE) ×1 IMPLANT
GLOVE BIOGEL PI INDICATOR 8 (GLOVE) ×2
GLOVE ECLIPSE 6.5 STRL STRAW (GLOVE) IMPLANT
GOWN STRL REUS W/ TWL LRG LVL3 (GOWN DISPOSABLE) ×2 IMPLANT
GOWN STRL REUS W/ TWL XL LVL3 (GOWN DISPOSABLE) ×1 IMPLANT
GOWN STRL REUS W/TWL LRG LVL3 (GOWN DISPOSABLE) ×6
GOWN STRL REUS W/TWL XL LVL3 (GOWN DISPOSABLE) ×3
MARKER SKIN DUAL TIP RULER LAB (MISCELLANEOUS) IMPLANT
NDL SAFETY ECLIPSE 18X1.5 (NEEDLE) ×2 IMPLANT
NEEDLE FILTER BLUNT 18X 1/2SAF (NEEDLE) ×2
NEEDLE FILTER BLUNT 18X1 1/2 (NEEDLE) ×1 IMPLANT
NEEDLE HYPO 18GX1.5 SHARP (NEEDLE) ×6
NEEDLE HYPO 25X1 1.5 SAFETY (NEEDLE) IMPLANT
NEEDLE SPNL 18GX3.5 QUINCKE PK (NEEDLE) ×3 IMPLANT
NS IRRIG 1000ML POUR BTL (IV SOLUTION) ×3 IMPLANT
PACK BASIN DAY SURGERY FS (CUSTOM PROCEDURE TRAY) ×3 IMPLANT
PENCIL SMOKE EVACUATOR (MISCELLANEOUS) ×3 IMPLANT
PIN SAFETY STERILE (MISCELLANEOUS) IMPLANT
SHEET MEDIUM DRAPE 40X70 STRL (DRAPES) IMPLANT
SLEEVE SCD COMPRESS KNEE MED (MISCELLANEOUS) ×3 IMPLANT
SPONGE LAP 18X18 RF (DISPOSABLE) ×9 IMPLANT
STAPLER INSORB 30 2030 C-SECTI (MISCELLANEOUS) ×3 IMPLANT
STAPLER VISISTAT 35W (STAPLE) ×6 IMPLANT
STRIP SUTURE WOUND CLOSURE 1/2 (MISCELLANEOUS) ×9 IMPLANT
SUT ETHILON 2 0 FS 18 (SUTURE) IMPLANT
SUT MNCRL AB 4-0 PS2 18 (SUTURE) ×9 IMPLANT
SUT PDS 3-0 CT2 (SUTURE) ×6
SUT PDS II 3-0 CT2 27 ABS (SUTURE) ×2 IMPLANT
SUT VIC AB 3-0 PS1 18 (SUTURE)
SUT VIC AB 3-0 PS1 18XBRD (SUTURE) IMPLANT
SUT VLOC 90 P-14 23 (SUTURE) ×9 IMPLANT
SYR 50ML LL SCALE MARK (SYRINGE) ×9 IMPLANT
SYR BULB IRRIGATION 50ML (SYRINGE) ×3 IMPLANT
SYR CONTROL 10ML LL (SYRINGE) IMPLANT
TAPE MEASURE VINYL STERILE (MISCELLANEOUS) IMPLANT
TOWEL GREEN STERILE FF (TOWEL DISPOSABLE) ×6 IMPLANT
TRAY FOLEY W/BAG SLVR 14FR LF (SET/KITS/TRAYS/PACK) IMPLANT
TUBE CONNECTING 20'X1/4 (TUBING) ×1
TUBE CONNECTING 20X1/4 (TUBING) ×2 IMPLANT
UNDERPAD 30X36 HEAVY ABSORB (UNDERPADS AND DIAPERS) ×6 IMPLANT
YANKAUER SUCT BULB TIP NO VENT (SUCTIONS) ×3 IMPLANT

## 2019-12-20 NOTE — Transfer of Care (Signed)
Immediate Anesthesia Transfer of Care Note  Patient: Marcia Newman  Procedure(s) Performed: MAMMARY REDUCTION  (BREAST) (Bilateral Breast)  Patient Location: PACU  Anesthesia Type:General  Level of Consciousness: awake and patient cooperative  Airway & Oxygen Therapy: Patient Spontanous Breathing and Patient connected to face mask oxygen  Post-op Assessment: Report given to RN and Post -op Vital signs reviewed and stable  Post vital signs: Reviewed and stable  Last Vitals:  Vitals Value Taken Time  BP    Temp    Pulse 99 12/20/19 1032  Resp 15 12/20/19 1032  SpO2 100 % 12/20/19 1032  Vitals shown include unvalidated device data.  Last Pain:  Vitals:   12/20/19 0631  TempSrc: Tympanic  PainSc: 0-No pain      Patients Stated Pain Goal: 8 (12/20/19 0631)  Complications: No apparent anesthesia complications

## 2019-12-20 NOTE — Discharge Instructions (Addendum)
Activity As tolerated: NO showers until 12/22/19 AM. Keep ACE wrap on breasts until then. After showering, put ACE wrap back on, this is important for compression. NO driving while in pain, taking pain medication or if you are unable to safely react to traffic. No heavy activities Take Pain medication (Norco) as needed for severe pain. Otherwise, you can use ibuprofen or tylenol PRN as well as indicated on your pre-op breast reduction instruction sheet provided at your pre-op appointment.  Diet: Regular  Wound Care: Keep dressing clean & dry. You may change bandages after showering if you continue to notice some drainage. You can reuse bandages if they are not dirty/soiled.  Special Instructions: Call Doctor if any unusual problems occur such as pain, excessive Bleeding, unrelieved Nausea/vomiting, Fever &/or chills  Follow-up appointment: Scheduled for next week.   Post Anesthesia Home Care Instructions  Activity: Get plenty of rest for the remainder of the day. A responsible individual must stay with you for 24 hours following the procedure.  For the next 24 hours, DO NOT: -Drive a car -Advertising copywriter -Drink alcoholic beverages -Take any medication unless instructed by your physician -Make any legal decisions or sign important papers.  Meals: Start with liquid foods such as gelatin or soup. Progress to regular foods as tolerated. Avoid greasy, spicy, heavy foods. If nausea and/or vomiting occur, drink only clear liquids until the nausea and/or vomiting subsides. Call your physician if vomiting continues.  Special Instructions/Symptoms: Your throat may feel dry or sore from the anesthesia or the breathing tube placed in your throat during surgery. If this causes discomfort, gargle with warm salt water. The discomfort should disappear within 24 hours.  Call your surgeon if you experience:   1.  Fever over 101.0. 2.  Inability to urinate. 3.  Nausea and/or vomiting. 4.  Extreme  swelling or bruising at the surgical site. 5.  Continued bleeding from the incision. 6.  Increased pain, redness or drainage from the incision. 7.  Problems related to your pain medication. 8.  Any problems and/or concerns

## 2019-12-20 NOTE — Anesthesia Procedure Notes (Signed)
Procedure Name: LMA Insertion Date/Time: 12/20/2019 7:51 AM Performed by:  Desanctis, CRNA Pre-anesthesia Checklist: Patient identified, Emergency Drugs available, Suction available, Patient being monitored and Timeout performed Patient Re-evaluated:Patient Re-evaluated prior to induction Oxygen Delivery Method: Circle system utilized Preoxygenation: Pre-oxygenation with 100% oxygen Induction Type: IV induction Ventilation: Mask ventilation without difficulty LMA: LMA inserted LMA Size: 4.0 Number of attempts: 1 Airway Equipment and Method: Bite block Placement Confirmation: positive ETCO2 Tube secured with: Tape Dental Injury: Teeth and Oropharynx as per pre-operative assessment

## 2019-12-20 NOTE — Anesthesia Postprocedure Evaluation (Signed)
Anesthesia Post Note  Patient: Marcia Newman  Procedure(s) Performed: MAMMARY REDUCTION  (BREAST) (Bilateral Breast)     Patient location during evaluation: PACU Anesthesia Type: General Level of consciousness: awake and alert Pain management: pain level controlled Vital Signs Assessment: post-procedure vital signs reviewed and stable Respiratory status: spontaneous breathing, nonlabored ventilation, respiratory function stable and patient connected to nasal cannula oxygen Cardiovascular status: blood pressure returned to baseline and stable Postop Assessment: no apparent nausea or vomiting Anesthetic complications: no    Last Vitals:  Vitals:   12/20/19 1140 12/20/19 1251  BP:  133/76  Pulse: 82 82  Resp: 18 16  Temp: (!) 36.4 C 36.4 C  SpO2: 97% 98%    Last Pain:  Vitals:   12/20/19 1251  TempSrc:   PainSc: 3                  Bruchy Mikel S

## 2019-12-20 NOTE — Anesthesia Preprocedure Evaluation (Signed)
Anesthesia Evaluation  Patient identified by MRN, date of birth, ID band Patient awake    Reviewed: Allergy & Precautions, NPO status , Patient's Chart, lab work & pertinent test results  Airway Mallampati: II  TM Distance: >3 FB Neck ROM: Full    Dental no notable dental hx.    Pulmonary neg pulmonary ROS,    Pulmonary exam normal breath sounds clear to auscultation       Cardiovascular negative cardio ROS Normal cardiovascular exam Rhythm:Regular Rate:Normal     Neuro/Psych negative neurological ROS  negative psych ROS   GI/Hepatic negative GI ROS, Neg liver ROS,   Endo/Other  negative endocrine ROS  Renal/GU negative Renal ROS  negative genitourinary   Musculoskeletal negative musculoskeletal ROS (+)   Abdominal   Peds negative pediatric ROS (+)  Hematology negative hematology ROS (+)   Anesthesia Other Findings   Reproductive/Obstetrics negative OB ROS                             Anesthesia Physical Anesthesia Plan  ASA: II  Anesthesia Plan: General   Post-op Pain Management:    Induction: Intravenous  PONV Risk Score and Plan: 3 and Ondansetron, Dexamethasone, Midazolam and Treatment may vary due to age or medical condition  Airway Management Planned: Oral ETT  Additional Equipment:   Intra-op Plan:   Post-operative Plan: Extubation in OR  Informed Consent: I have reviewed the patients History and Physical, chart, labs and discussed the procedure including the risks, benefits and alternatives for the proposed anesthesia with the patient or authorized representative who has indicated his/her understanding and acceptance.     Dental advisory given  Plan Discussed with: CRNA  Anesthesia Plan Comments:         Anesthesia Quick Evaluation  

## 2019-12-20 NOTE — Op Note (Signed)
Operative Note   DATE OF OPERATION: 12/20/2019  LOCATION: [facility]   SURGICAL DEPARTMENT: Plastic Surgery  PREOPERATIVE DIAGNOSES: Bilateral symptomatic macromastia.  POSTOPERATIVE DIAGNOSES:  same  PROCEDURE: Bilateral breast reduction with superomedial pedicle.  SURGEON: Ancil Linsey, MD  ASSISTANT: Zadie Cleverly, PA  ANESTHESIA: General.  COMPLICATIONS: None.   INDICATIONS FOR PROCEDURE:  The patient, Marcia Newman is a 54 y.o. female born on 19-Dec-1965, is here for treatment of bilateral symptomatic macromastia. MRN: [id number]  CONSENT:  Informed consent was obtained directly from the patient. Risks, benefits and alternatives were fully discussed. Specific risks including but not limited to bleeding, infection, hematoma, seroma, scarring, pain, infection, contracture, asymmetry, wound healing problems, and need for further surgery were all discussed. The patient did have an ample opportunity to have questions answered to satisfaction.   DESCRIPTION OF PROCEDURE:  The patient was marked preoperatively for a Wise pattern skin excision.  The patient was taken to the operating room. SCDs were placed and antibiotics were given. General anesthesia was administered.The patient's operative site was prepped and draped in a sterile fashion. A time out was performed and all information was confirmed to be correct.  Right Breast: The breast was infiltrated with tumescent solution to help with hemostasis.  The nipple was marked with a cookie cutter.  A superomedial pedicle was drawn out with the base of at least 8 cm in size.  A breast tourniquet was then applied and the pedicle was de-epithelialized.  Breast tourniquet was then let down and all incisions were made with a 10 blade.  The pedicle was then isolated down to the chest wall with cautery and the excision was performed removing tissue primarily inferiorly and laterally.  Hemostasis was obtained and the wound was  stapled closed.  Left breast:  The breast was infiltrated with tumescent solution to help with hemostasis.  The nipple was marked with a cookie cutter.  A superomedial pedicle was drawn out with the base of at least 8 cm in size.  A breast tourniquet was then applied and the pedicle was de-epithelialized.  Breast tourniquet was then let down and all incisions were made with a 10 blade.  The pedicle was then isolated down to the chest wall with cautery and the excision was performed removing tissue primarily inferiorly and laterally.  Hemostasis was obtained and the wound was stapled closed.  Patient was then set up to check for size and symmetry.  Minor modifications were made.  This resulted in a total of 821g removed from the right side and 779g removed from the left side.  The inframammary incision was closed with a combination of buried in-sorb staples and a running 3-0 Quill suture.  The vertical and periareolar limbs were closed with interrupted buried 4-0 Monocryl and a running 4-0 Quill suture.  Steri-Strips were then applied along with a soft dressing and Ace wrap.  The patient tolerated the procedure well.  There were no complications. The patient was allowed to wake from anesthesia, extubated and taken to the recovery room in satisfactory condition.  I was present for the entire procedure.

## 2019-12-20 NOTE — Interval H&P Note (Signed)
History and Physical Interval Note:  12/20/2019 7:41 AM  Marcia Newman  has presented today for surgery, with the diagnosis of Macromastia.  The various methods of treatment have been discussed with the patient and family. After consideration of risks, benefits and other options for treatment, the patient has consented to  Procedure(s) with comments: MAMMARY REDUCTION  (BREAST) (Bilateral) - 3.5 hours as a surgical intervention.  The patient's history has been reviewed, patient examined, no change in status, stable for surgery.  I have reviewed the patient's chart and labs.  Questions were answered to the patient's satisfaction.     Allena Napoleon

## 2019-12-20 NOTE — Brief Op Note (Signed)
12/20/2019  10:03 AM  PATIENT:  Marcia Newman  54 y.o. female  PRE-OPERATIVE DIAGNOSIS:  Macromastia  POST-OPERATIVE DIAGNOSIS:  Macromastia  PROCEDURE:  Procedure(s): MAMMARY REDUCTION  (BREAST) (Bilateral)  SURGEON:  Surgeon(s) and Role:    * Ronnesha Mester, Wendy Poet, MD - Primary  PHYSICIAN ASSISTANT: Materials engineer, PA  ASSISTANTS: none   ANESTHESIA:   general  EBL:  50 mL   BLOOD ADMINISTERED:none  DRAINS: none   LOCAL MEDICATIONS USED:  MARCAINE     SPECIMEN:  Source of Specimen:  r and l breast  DISPOSITION OF SPECIMEN:  PATHOLOGY  COUNTS:  YES  TOURNIQUET:None  DICTATION: .Office manager  PLAN OF CARE: Discharge to home after PACU  PATIENT DISPOSITION:  PACU - hemodynamically stable.   Delay start of Pharmacological VTE agent (>24hrs) due to surgical blood loss or risk of bleeding: not applicable

## 2019-12-21 ENCOUNTER — Encounter: Payer: Self-pay | Admitting: *Deleted

## 2019-12-21 LAB — SURGICAL PATHOLOGY

## 2019-12-27 ENCOUNTER — Encounter: Payer: Self-pay | Admitting: Plastic Surgery

## 2019-12-27 ENCOUNTER — Other Ambulatory Visit: Payer: Self-pay

## 2019-12-27 ENCOUNTER — Encounter: Payer: 59 | Admitting: Plastic Surgery

## 2019-12-27 ENCOUNTER — Ambulatory Visit (INDEPENDENT_AMBULATORY_CARE_PROVIDER_SITE_OTHER): Payer: 59 | Admitting: Plastic Surgery

## 2019-12-27 VITALS — BP 115/75 | HR 85 | Temp 97.3°F | Ht 66.0 in | Wt 175.0 lb

## 2019-12-27 DIAGNOSIS — N62 Hypertrophy of breast: Secondary | ICD-10-CM

## 2019-12-27 NOTE — Progress Notes (Signed)
Patient is postop from bilateral breast reduction.  She is doing well with no complaints.  Her pain is controlled.  On exam everything looks good and her incisions are intact.  She has mild swelling laterally on either side but no bruising and no skin signs of any skin compromise.  The nipple areolar complexes are viable on both sides.  I have asked her to continue to avoid strenuous activity for another few weeks and then will see her again in the office.  She does have any trouble in the meantime she can give [place] a call or come in sooner if need be.  All of her questions were answered.  I did ask her to continue to wear compressive garment either the Ace wrap or a sports bra until we see her back.

## 2020-01-19 DIAGNOSIS — Z9889 Other specified postprocedural states: Secondary | ICD-10-CM | POA: Insufficient documentation

## 2020-01-19 NOTE — Progress Notes (Signed)
Patient is a 54 year old female here for follow-up after bilateral breast reduction on 12/20/2019 with Dr. Arita Miss. (821 g right; 779 g left)  Incisions are healing very well, C/D/I.  Steri-Strips were removed today for examination.  No signs of infection, redness, drainage.  She has some mild swelling laterally on both breasts.  Reports some soreness at these areas.  4 weeks PO.  Continue to wear sports bra 24/7 for 2 more weeks.  May then just wear sports bra during the day if desired.  May begin to gradually increase activity as tolerated. Avoid any heavy lifting for 1 more week. May shower normally.  Pictures were obtained of the patient and placed in the chart with the patient's or guardian's permission.  The 21st Century Cures Act was signed into law in 2016 which includes the topic of electronic health records.  This provides immediate access to information in MyChart.  This includes consultation notes, operative notes, office notes, lab results and pathology reports.  If you have any questions about what you read please let us know at your next visit or call us at the office.  We are right here with you.

## 2020-01-20 ENCOUNTER — Encounter: Payer: Self-pay | Admitting: Plastic Surgery

## 2020-01-20 ENCOUNTER — Ambulatory Visit (INDEPENDENT_AMBULATORY_CARE_PROVIDER_SITE_OTHER): Payer: 59 | Admitting: Plastic Surgery

## 2020-01-20 ENCOUNTER — Other Ambulatory Visit: Payer: Self-pay

## 2020-01-20 DIAGNOSIS — Z9889 Other specified postprocedural states: Secondary | ICD-10-CM

## 2020-02-08 NOTE — Progress Notes (Signed)
Patient is a 54 year old female here for follow-up after bilateral breast reduction on 12/20/2019 with Dr. Arita Miss.  (821 g right; 779 g left).  She is doing really well.  She reports a little bit of tenderness with walking when at work, she attributes this to walking on concrete floors at work and having to wear steel toe boots.  She has questions about when NAC sensation will return.  Patient denies any fever, chills, nausea, vomiting.  Chaperone present On exam bilateral breast incisions are C/D/I, no erythema, no drainage, no wounds noted.  She does have a little scab at approximately 7:00 of the left NAC.  Bilateral breasts are soft.  Breasts are symmetric.  No fluid wave noted.  No tenderness to palpation.  Plan: Patient is doing well, no sign of any infection, seroma, hematoma.  Recommend trying Tylenol prior to working to decrease tenderness with ambulation.  This will improve with time.  Can begin using scar cream on right breast, weight another week or so until scab on left breast heals prior to using scar cream.  Recommend Mederma or Skinuva.  We discussed sensation of bilateral NAC may improve with time -approximately 6 months, sensation may return stronger than preoperatively, weaker than preoperatively or return to normal.  Call with any questions or concerns.

## 2020-02-10 ENCOUNTER — Encounter: Payer: Self-pay | Admitting: Plastic Surgery

## 2020-02-10 ENCOUNTER — Other Ambulatory Visit: Payer: Self-pay

## 2020-02-10 ENCOUNTER — Ambulatory Visit (INDEPENDENT_AMBULATORY_CARE_PROVIDER_SITE_OTHER): Payer: 59 | Admitting: Surgical

## 2020-02-10 VITALS — BP 120/79 | HR 69 | Temp 97.5°F | Ht 66.0 in | Wt 170.0 lb

## 2020-02-10 DIAGNOSIS — Z9889 Other specified postprocedural states: Secondary | ICD-10-CM

## 2020-02-10 DIAGNOSIS — N62 Hypertrophy of breast: Secondary | ICD-10-CM

## 2020-05-11 NOTE — Progress Notes (Signed)
Patient is a 54 year old female here for follow-up after undergoing bilateral breast reduction on 12/20/2019 with Dr. Arita Miss.  (821 g right; 779 g left)  ~ 20 weeks PO Patient reports she is doing very well, no complaints or concerns.  Incisions are healing very nicely bilaterally, C/D/I.  No signs of infection, redness, drainage, seroma/hematoma.  Patient denies fever, chest pain, shortness of breath, nausea/vomiting.  She is very pleased with her results.  Follow-up as needed.  Call office with any questions/concerns.  Pictures were obtained of the patient and placed in the chart with the patient's or guardian's permission.  The 21st Century Cures Act was signed into law in 2016 which includes the topic of electronic health records.  This provides immediate access to information in MyChart.  This includes consultation notes, operative notes, office notes, lab results and pathology reports.  If you have any questions about what you read please let us know at your next visit or call us at the office.  We are right here with you.

## 2020-05-12 ENCOUNTER — Other Ambulatory Visit: Payer: Self-pay

## 2020-05-12 ENCOUNTER — Ambulatory Visit (INDEPENDENT_AMBULATORY_CARE_PROVIDER_SITE_OTHER): Payer: 59 | Admitting: Plastic Surgery

## 2020-05-12 ENCOUNTER — Encounter: Payer: Self-pay | Admitting: Plastic Surgery

## 2020-05-12 VITALS — BP 134/73 | HR 62 | Temp 98.5°F

## 2020-05-12 DIAGNOSIS — Z9889 Other specified postprocedural states: Secondary | ICD-10-CM

## 2020-10-02 ENCOUNTER — Ambulatory Visit (INDEPENDENT_AMBULATORY_CARE_PROVIDER_SITE_OTHER): Payer: 59 | Admitting: Family Medicine

## 2020-10-02 ENCOUNTER — Ambulatory Visit (INDEPENDENT_AMBULATORY_CARE_PROVIDER_SITE_OTHER)
Admission: RE | Admit: 2020-10-02 | Discharge: 2020-10-02 | Disposition: A | Discharge status: Home / Self Care | Payer: 59 | Source: Ambulatory Visit | Attending: Family Medicine | Admitting: Family Medicine

## 2020-10-02 ENCOUNTER — Encounter: Payer: Self-pay | Admitting: Family Medicine

## 2020-10-02 ENCOUNTER — Other Ambulatory Visit: Payer: Self-pay

## 2020-10-02 VITALS — BP 100/60 | HR 83 | Temp 98.4°F | Ht 66.0 in | Wt 166.5 lb

## 2020-10-02 DIAGNOSIS — S63111A Subluxation of metacarpophalangeal joint of right thumb, initial encounter: Secondary | ICD-10-CM

## 2020-10-02 DIAGNOSIS — S63641A Sprain of metacarpophalangeal joint of right thumb, initial encounter: Secondary | ICD-10-CM

## 2020-10-02 DIAGNOSIS — M25641 Stiffness of right hand, not elsewhere classified: Secondary | ICD-10-CM

## 2020-10-02 DIAGNOSIS — M79641 Pain in right hand: Secondary | ICD-10-CM

## 2020-10-02 NOTE — Progress Notes (Signed)
Amaliya Whitelaw T. Tristen Pennino, MD, CAQ Sports Medicine  Primary Care and Sports Medicine [facility] [address] [place], [address]  Phone: [phone]  FAX: [phone]  Ladeana Laplant - 55 y.o. female  MRN [id number]  Date of Birth: May 14, 1966  Date: 10/02/2020  PCP: Emi Belfast, FNP  Referral: Emi Belfast, FNP  Chief Complaint  Patient presents with  . Hand Injury    Tripped and fell at work > 1 month ago    This visit occurred during the SARS-CoV-2 public health emergency.  Safety protocols were in place, including screening questions prior to the visit, additional usage of staff PPE, and extensive cleaning of exam room while observing appropriate contact time as indicated for disinfecting solutions.   Subjective:   Marcia Newman is a 55 y.o. very pleasant female patient with Body mass index is 26.87 kg/m. who presents with the following:  Larey Seat on R thumb 1 month ago.  Works with hands all of the time. Fell directly on thumb.  She comes in for evaluation, this is the 1st time she is sought care for her thumb injury.  She fell on her hand and thumb and there was forceful lateral deviation.  She felt some immediate pain, and since then she has had difficulty using her thumb.  She has having some difficulty grasping, and she has not able to make a complete fist at this point.  She has extensive tenderness around the MCP joint and to a lesser extent surrounding this.  There is currently no bruising.  There is some mild swelling about the MCP joint.  She also has some tenderness very mildly at the scaphoid.  Review of Systems is noted in the HPI, as appropriate   Objective:   BP 100/60   Pulse 83   Temp 98.4 F (36.9 C) (Temporal)   Ht 5\' 6"  (1.676 m)   Wt 166 lb 8 oz (75.5 kg)   SpO2 95%   BMI 26.87 kg/m   Right-sided radius and ulna including distal radius and ulna are entirely nontender.  Phalanges and metacarpals  2 through 5 are entirely nontender.  The wrist is nontender.  She does have some mild tenderness at the scaphoid.  The IP joint on the 1st is nontender.  The MCP joint is somewhat swollen and it appears to open up some when stress of the ulnar collateral ligament.  Also appears to be in somewhat of a malposition, but does not appear to be frankly dislocated.  Strength is preserved throughout all DIP, PIP, and MCP joints.  Patient is not able to make a complete fist and her thumb is not able to come to a normal position with regards to her hand and wrist.  Radiology: DG Hand Complete Right  Result Date: 10/02/2020 CLINICAL DATA:  trauma 1 month ago, fell on thumb EXAM: RIGHT HAND - COMPLETE 3+ VIEW COMPARISON:  None. FINDINGS: No fracture or focal osseous lesion. Mild subluxation of the first MCP joint. Otherwise normal hand alignment. Soft tissues are within normal limits. IMPRESSION: No fracture.  Mild first MCP joint subluxation. Electronically Signed   By: 11/30/2020 M.D.   On: 10/02/2020 09:47     Assessment and Plan:     ICD-10-CM   1. Skier's thumb, right, initial encounter  11/30/2020 Ambulatory referral to Hand Surgery  2. Right hand pain  M79.641 DG Hand Complete Right    Ambulatory referral to Hand Surgery  3. Decreased range of motion of right thumb  M25.641 Ambulatory referral to Hand Surgery  4. Subluxation of metacarpophalangeal joint of right thumb, initial encounter  S63.111A    By history, the patient has a acute skiers thumb, and she does open up some with stress of the ulnar collateral ligament.  I do agree with radiology that the distal thumb does appear to be somewhat malpositioned compared to the metacarpal at the 1st.  Prior notes, independent review of x-ray, and and consultation.  Orders Placed This Encounter  Procedures  . DG Hand Complete Right  . Ambulatory referral to Hand Surgery    Follow-up: No follow-ups on file.  Signed,  Elpidio Galea.  Makinley Muscato, MD   Outpatient Encounter Medications as of 10/02/2020  Medication Sig  . Ascorbic Acid (VITAMIN C) 1000 MG tablet Take 1,000 mg by mouth daily.  . Cholecalciferol (VITAMIN D-3) 125 MCG (5000 UT) TABS Take 1 tablet by mouth daily.  Marland Kitchen levonorgestrel (MIRENA) 20 MCG/24HR IUD 1 each by Intrauterine route once.   No facility-administered encounter medications on file as of 10/02/2020.

## 2020-10-03 ENCOUNTER — Telehealth: Payer: Self-pay | Admitting: Family Medicine

## 2020-10-03 NOTE — Telephone Encounter (Signed)
Letter written as instructed by Dr. Patsy Lager.  Annabelle notified by telephone that letter is ready to be picked up at the front desk.

## 2020-10-03 NOTE — Telephone Encounter (Signed)
Letter  The patient has a limited light duty capacity in regards to her right thumb.  Limitation would be to grasp and lift a maximum amount of 5 pounds with her right hand and wrist.  She has no limitations with her left hand or wrist, and she has no limitations with ambulation or movement.  She has a hand surgery appointment pending.

## 2020-10-03 NOTE — Telephone Encounter (Signed)
Patient called in stating that she was seen by Dr Patsy Lager and he put her on light duty due to her thumb. Her employer is requesting more detail as to what her light duty requirements are. Please advise. Patient is requesting another letter. EM

## 2021-04-30 ENCOUNTER — Telehealth: Payer: Self-pay

## 2021-04-30 NOTE — Telephone Encounter (Signed)
Emily at front desk sent note pt having dizziness and access nurse advised pt needs to be seen 04/30/21. I spoke with pt; starting on 04/27/21 at night developed on and off dizziness where room is spinning.pt said for few mins if drinks water will help dizziness for few minutes and then dizziness starts again. No dry mouth and no abd pain. Pt said has not been drinking as she should but pt has been drinking water. No H/A,CP or SOB or pain in neck arm or jaw area; no covid symptoms per pt. Pt said she has never had dizziness before and pt does not have way to ck BP. Pt is presently at work and works close to [employer] in [place]. Pt will go to [facility] in [place] when gets off work; no dizziness at this time. Pt is aware should not drive if problems with dizziness. UC & ED precautions given and pt voiced understanding. FYI to Audria Nine NP who pt has a TOC from Harlin Heys FNP on 05/30/21. Sending note to [facility] CMA also and [facility] pool.

## 2021-04-30 NOTE — Telephone Encounter (Signed)
Please note below access nurse note; have already spoken with pt.

## 2021-04-30 NOTE — Telephone Encounter (Signed)
[facility] - Client TELEPHONE ADVICE RECORD AccessNurse Patient Name: Marcia Newman P Gender: Female DOB: 09/05/1966 Age: 54 Y 10 M 29 D Return Phone Number: [phone] (Primary) Address: City/ State/ Zip: [place] [address] Client [facility] - Client Client Site [facility] - Day Physician Gessner, Debbie- NP Contact Type Call Who Is Calling Patient / Member / Family / Caregiver Call Type Triage / Clinical Relationship To Patient Self Return Phone Number [phone] (Primary) Chief Complaint Dizziness Reason for Call Symptomatic / Request for Health Information Initial Comment Caller states she is having dizziness and the office wanted her triaged. She states it has been going on over the weekend. Translation No Nurse Assessment Nurse: Myers, RN, Beth Date/Time (Eastern Time): 04/30/2021 1:24:53 PM Confirm and document reason for call. If symptomatic, describe symptoms. ---Caller states she is having dizziness (states mostly at night and wakes up in middle of night with room spinning) and the office wanted her triaged. Caller states it has been going on over the weekend. Does the patient have any new or worsening symptoms? ---Yes Will a triage be completed? ---Yes Related visit to physician within the last 2 weeks? ---No Does the PT have any chronic conditions? (i.e. diabetes, asthma, this includes High risk factors for pregnancy, etc.) ---No Is the patient pregnant or possibly pregnant? (Ask all females between the ages of 12-55) ---No Is this a behavioral health or substance abuse call? ---No Guidelines Guideline Title Affirmed Question Affirmed Notes Nurse Date/Time (Eastern Time) Dizziness - Vertigo [1] MODERATE dizziness (e.g., vertigo; feels very unsteady, interferes with normal activities) AND [2] has NOT Myers, RN, Beth 04/30/2021 1:26:11 PM PLEASE NOTE: All timestamps  contained within this report are represented as Eastern Standard Time. CONFIDENTIALTY NOTICE: This fax transmission is intended only for the addressee. It contains information that is legally privileged, confidential or otherwise protected from use or disclosure. If you are not the intended recipient, you are strictly prohibited from reviewing, disclosing, copying using or disseminating any of this information or taking any action in reliance on or regarding this information. If you have received this fax in error, please notify us immediately by telephone so that we can arrange for its return to us. Phone: [phone], Toll-Free: [phone], Fax: [phone] Page: 2 of 2 Call Id: [id number] Guidelines Guideline Title Affirmed Question Affirmed Notes Nurse Date/Time (Eastern Time) been evaluated by physician for this Disp. Time (Eastern Time) Disposition Final User 04/30/2021 1:18:55 PM Attempt made - message left Myers, RN, Beth 04/30/2021 1:30:19 PM See PCP within 24 Hours Yes Myers, RN, Beth Caller Disagree/Comply Comply Caller Understands Yes PreDisposition Did not know what to do Care Advice Given Per Guideline SEE PCP WITHIN 24 HOURS: * IF OFFICE WILL BE OPEN: You need to be examined within the next 24 hours. Call your doctor (or NP/PA) when the office opens and make an appointment. CALL BACK IF: * Severe headache occurs * Weakness develops in an arm or leg * Unable to walk without falling * You become worse CARE ADVICE given per Dizziness - Vertigo (Adult) guideline. Referrals REFERRED TO PCP OFFICE 

## 2021-05-01 ENCOUNTER — Ambulatory Visit
Admission: EM | Admit: 2021-05-01 | Discharge: 2021-05-01 | Disposition: A | Discharge status: Home / Self Care | Payer: 59 | Attending: Physician Assistant | Admitting: Physician Assistant

## 2021-05-01 ENCOUNTER — Other Ambulatory Visit: Payer: Self-pay

## 2021-05-01 DIAGNOSIS — R42 Dizziness and giddiness: Secondary | ICD-10-CM | POA: Diagnosis not present

## 2021-05-01 DIAGNOSIS — R030 Elevated blood-pressure reading, without diagnosis of hypertension: Secondary | ICD-10-CM

## 2021-05-01 MED ORDER — MECLIZINE HCL 25 MG PO TABS: 25.0000 mg | ORAL_TABLET | Freq: Three times a day (TID) | ORAL | 0 refills | Status: DC | PRN

## 2021-05-01 NOTE — Telephone Encounter (Signed)
Patient called stating that she was advised to go to an UC yesterday. Patient stated that she worked late and never made it to the UC last night. Patient stated that she is off today and went to the dentist. Patient stated that her blood pressure at the dentist office was 185/121. Patent denies chest pain, SOB or dizziness at the present time. Patient was advised that she should plan on going to the UC like she was advised yesterday. Patient stated that she will go to the [facility] shortly since there are not any appointments available today at our office.

## 2021-05-01 NOTE — ED Triage Notes (Signed)
Patient presents to Urgent Care with complaints of dizziness since Friday. She states she spoke with PCP who instructed her to come in to UC for eval. She states she has no hx of HTN  but at dental appt was told her BP was in the 180's. Pt reports these dizzy spells occur at bedtime she states "I wake up and am real dizzy."  Denies any changes in vision or numbness/weakness in extremities.

## 2021-05-01 NOTE — Discharge Instructions (Addendum)
No alarming signs on exam.  Your neurology exam was intact.  Your blood pressure today was 148/82, I do not think this is causing your dizziness.  Meclizine as needed Keep hydrated, urine should be clear to pale yellow in color.  Monitor your blood pressure, and follow-up with primary care for further management needed.  If blood pressure elevated to 190-200 of top number, having chest pain, shortness of breath, confusion, go to the emergency department for further evaluation

## 2021-05-01 NOTE — ED Provider Notes (Addendum)
[facility]    CSN: [id number] Arrival date & time: 05/01/21  1409      History   Chief Complaint Chief Complaint  Patient presents with   Hypertension    HPI Marcia Newman is a 55 y.o. female.   55 year old female comes in for 5 day history of dizziness.  States felt dizzy when laying down to go to sleep, and when waking up in the middle of the night, will feel dizzy as well.  She describes dizziness as spinning sensation.  States during the day, does not experience dizziness regardless of movement, position changes.  She denies any recent URI symptoms, decrease in fluid intake.  Denies urinary changes. Denies abdominal pain, nausea, vomiting. Denies changes in medicine.  Denies fever, chills, body aches.  States did not experience the symptoms last night or today.  However, while at dental appointment, was told that her systolic BP was in the 180s, and therefore came in for evaluation.  She denies history of HTN.  Denies headache, vision changes, one-sided weakness, chest pain, shortness of breath   Past Medical History:  Diagnosis Date   H/O LEEP    History of C-section    Macromastia    bil   SVD (spontaneous vaginal delivery)    x 4, 1 stillborn delivery    Patient Active Problem List   Diagnosis Date Noted   S/P bilateral breast reduction 01/19/2020    Past Surgical History:  Procedure Laterality Date   BREAST REDUCTION SURGERY Bilateral 12/20/2019   Procedure: MAMMARY REDUCTION  (BREAST);  Surgeon: Allena Napoleon, MD;  Location: [facility];  Service: Plastics;  Laterality: Bilateral;   CESAREAN SECTION  2008   x 1   WISDOM TOOTH EXTRACTION      OB History   No obstetric history on file.      Home Medications    Prior to Admission medications   Medication Sig Start Date End Date Taking? Authorizing Provider  meclizine (ANTIVERT) 25 MG tablet Take 1 tablet (25 mg total) by mouth 3 (three) times daily as needed for dizziness.  05/01/21  Yes Odena Mcquaid V, PA-C  Ascorbic Acid (VITAMIN C) 1000 MG tablet Take 1,000 mg by mouth daily.    [provider]  Cholecalciferol (VITAMIN D-3) 125 MCG (5000 UT) TABS Take 1 tablet by mouth daily.    [provider]  levonorgestrel (MIRENA) 20 MCG/24HR IUD 1 each by Intrauterine route once.    [provider]    Family History Family History  Problem Relation Age of Onset   Hypertension Paternal Grandmother    Colon cancer Neg Hx    Rectal cancer Neg Hx    Stomach cancer Neg Hx    Breast cancer Neg Hx     Social History Social History   Tobacco Use   Smoking status: Never   Smokeless tobacco: Never  Vaping Use   Vaping Use: Never used  Substance Use Topics   Alcohol use: Yes    Comment: occasional   Drug use: Never     Allergies   Patient has no known allergies.   Review of Systems Review of Systems  Reason unable to perform ROS: See HPI as above.    Physical Exam Triage Vital Signs ED Triage Vitals  Enc Vitals Group     BP 05/01/21 1519 (S) (!) 148/82     Pulse Rate 05/01/21 1519 66     Resp 05/01/21 1519 16  Temp 05/01/21 1519 98.8 F (37.1 C)     Temp Source 05/01/21 1519 Oral     SpO2 05/01/21 1519 98 %     Weight --      Height --      Head Circumference --      Peak Flow --      Pain Score 05/01/21 1524 0     Pain Loc --      Pain Edu? --      Excl. in GC? --    No data found.  Updated Vital Signs BP (S) (!) 148/82 (BP Location: Left Arm)   Pulse 66   Temp 98.8 F (37.1 C) (Oral)   Resp 16   SpO2 98%   Physical Exam Constitutional:      General: She is not in acute distress.    Appearance: Normal appearance. She is well-developed. She is not ill-appearing, toxic-appearing or diaphoretic.  HENT:     Head: Normocephalic and atraumatic.     Right Ear: Tympanic membrane, ear canal and external ear normal. Tympanic membrane is not erythematous or bulging.     Left Ear: Tympanic membrane, ear canal and  external ear normal. Tympanic membrane is not erythematous or bulging.     Nose:     Right Sinus: No maxillary sinus tenderness or frontal sinus tenderness.     Left Sinus: No maxillary sinus tenderness or frontal sinus tenderness.     Mouth/Throat:     Mouth: Mucous membranes are moist.     Pharynx: Oropharynx is clear. Uvula midline.  Eyes:     Conjunctiva/sclera: Conjunctivae normal.     Pupils: Pupils are equal, round, and reactive to light.  Cardiovascular:     Rate and Rhythm: Normal rate and regular rhythm.  Pulmonary:     Effort: Pulmonary effort is normal. No accessory muscle usage, prolonged expiration, respiratory distress or retractions.     Breath sounds: No decreased air movement or transmitted upper airway sounds. No decreased breath sounds.     Comments: LCTAB Musculoskeletal:     Cervical back: Normal range of motion and neck supple.  Skin:    General: Skin is warm and dry.  Neurological:     Mental Status: She is alert and oriented to person, place, and time.     Comments: Cranial nerves II-XII grossly intact. Strength 5/5 bilaterally for upper and lower extremity. Sensation intact. Normal coordination with normal finger to nose, heel to shin. Negative pronator drift, romberg. Gait intact. Able to ambulate on own without difficulty.      UC Treatments / Results  Labs (all labs ordered are listed, but only abnormal results are displayed) Labs Reviewed - No data to display  EKG   Radiology No results found.  Procedures Procedures (including critical care time)  Medications Ordered in UC Medications - No data to display  Initial Impression / Assessment and Plan / UC Course  I have reviewed the triage vital signs and the nursing notes.  Pertinent labs & imaging results that were available during my care of the patient were reviewed by me and considered in my medical decision making (see chart for details).    Patient BP 148/82 in office today.  She is  afebrile, nontoxic.  She is also currently asymptomatic.  Neurology exam grossly intact without focal deficits. ?  Vertigo that has since resolved.  Discussed BP readings can fluctuate depending on stress, discomfort/pain, fluid intake.  Given without history of HTN, currently would  be best to monitor.  Will have patient push fluids, keep BP log for PCP evaluation.  Will provide meclizine if needed.  Return precautions given.  Patient expresses understanding and agrees with plan.  Final Clinical Impressions(s) / UC Diagnoses   Final diagnoses:  Dizziness  Elevated blood pressure reading     ED Prescriptions     Medication Sig Dispense Auth. Provider   meclizine (ANTIVERT) 25 MG tablet Take 1 tablet (25 mg total) by mouth 3 (three) times daily as needed for dizziness. 30 tablet Belinda Fisher, PA-C      PDMP not reviewed this encounter.   Belinda Fisher, PA-C 05/01/21 1750    Belinda Fisher, PA-C 05/01/21 1750

## 2021-05-30 ENCOUNTER — Encounter: Payer: 59 | Admitting: Nurse Practitioner

## 2021-06-21 ENCOUNTER — Ambulatory Visit (INDEPENDENT_AMBULATORY_CARE_PROVIDER_SITE_OTHER): Payer: 59 | Admitting: Nurse Practitioner

## 2021-06-21 ENCOUNTER — Other Ambulatory Visit: Payer: Self-pay

## 2021-06-21 ENCOUNTER — Encounter: Payer: Self-pay | Admitting: Nurse Practitioner

## 2021-06-21 VITALS — BP 120/84 | HR 72 | Temp 98.2°F | Resp 10 | Ht 65.25 in | Wt 176.5 lb

## 2021-06-21 DIAGNOSIS — Z1231 Encounter for screening mammogram for malignant neoplasm of breast: Secondary | ICD-10-CM | POA: Diagnosis not present

## 2021-06-21 DIAGNOSIS — I951 Orthostatic hypotension: Secondary | ICD-10-CM | POA: Diagnosis not present

## 2021-06-21 DIAGNOSIS — I959 Hypotension, unspecified: Secondary | ICD-10-CM | POA: Insufficient documentation

## 2021-06-21 DIAGNOSIS — Z0000 Encounter for general adult medical examination without abnormal findings: Secondary | ICD-10-CM | POA: Diagnosis not present

## 2021-06-21 DIAGNOSIS — Z1321 Encounter for screening for nutritional disorder: Secondary | ICD-10-CM | POA: Diagnosis not present

## 2021-06-21 NOTE — Progress Notes (Signed)
Established Patient Office Visit  Subjective:  Patient ID: Marcia Newman, female    DOB: 09-Sep-1966  Age: 55 y.o. MRN: [id number]  CC:  Chief Complaint  Patient presents with   Transfer of Care   Dizziness    Had issues off and on the past 1 to 2 months. Today had an episode at the store of lightheadedness for about 10 to 15 minutes. On 05/01/21 went to Urgent Care due to dizziness but no medication was given and sx went away on its own    HPI Marcia Newman presents for [facility] and Dizziness for complete physical and follow up of chronic conditions.  Immunizations: -Tetanus: unsure  -Influenza: refused -Covid-19: refused -Shingles: Educated -Pneumonia: NA  -HPV: NA  Diet: Fair diet. Eats 3 times a day. Exercise: No regular exercise.  Eye exam: Completes annually  Dental exam: Completes semi-annually   Pap Smear: Need Mammogram: needs in [place] [facility]: Colonoscopy: 11/2018   Lung Cancer Screening: non smoker, never smoker  Dizziness: States she had a dizzy spell and she felt it coming on. States that she does not drink enough water. Intermittent in nature. Describes it as light headed. Was evaluated in UC and prescribed meclizine that she has never picked up or tried.  Past Medical History:  Diagnosis Date   H/O LEEP    History of C-section    Macromastia    bil   SVD (spontaneous vaginal delivery)    x 4, 1 stillborn delivery    Past Surgical History:  Procedure Laterality Date   BREAST REDUCTION SURGERY Bilateral 12/20/2019   Procedure: MAMMARY REDUCTION  (BREAST);  Surgeon: Allena Napoleon, MD;  Location: [facility];  Service: Plastics;  Laterality: Bilateral;   CESAREAN SECTION  2008   x 1   WISDOM TOOTH EXTRACTION      Family History  Problem Relation Age of Onset   Hypertension Paternal Grandmother    Colon cancer Neg Hx    Rectal cancer Neg Hx    Stomach cancer Neg Hx    Breast cancer Neg Hx     Social History    Socioeconomic History   Marital status: Divorced    Spouse name: Not on file   Number of children: Not on file   Years of education: Not on file   Highest education level: Not on file  Occupational History   Not on file  Tobacco Use   Smoking status: Never   Smokeless tobacco: Never  Vaping Use   Vaping Use: Never used  Substance and Sexual Activity   Alcohol use: Yes    Comment: occasional   Drug use: Never   Sexual activity: Yes    Birth control/protection: I.U.D.    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    Has one son in the home, grown son in [place]. Two grandchildren. Her mother lives with her.    Social Determinants of Health   Financial Resource Strain: Not on file  Food Insecurity: Not on file  Transportation Needs: Not on file  Physical Activity: Not on file  Stress: Not on file  Social Connections: Not on file  Intimate Partner Violence: Not on file    Outpatient Medications Prior to Visit  Medication Sig Dispense Refill   Ascorbic Acid (VITAMIN C) 1000 MG tablet Take 1,000 mg by mouth daily.  Cholecalciferol (VITAMIN D-3) 125 MCG (5000 UT) TABS Take 1 tablet by mouth daily.     levonorgestrel (MIRENA) 20 MCG/24HR IUD 1 each by Intrauterine route once.     meclizine (ANTIVERT) 25 MG tablet Take 1 tablet (25 mg total) by mouth 3 (three) times daily as needed for dizziness. 30 tablet 0   No facility-administered medications prior to visit.    No Known Allergies  ROS Review of Systems  Constitutional:  Negative for chills and fever.  Respiratory:  Negative for cough and shortness of breath.   Cardiovascular:  Negative for chest pain.  Gastrointestinal:  Negative for blood in stool, constipation, diarrhea, nausea and vomiting.  Neurological:  Positive for light-headedness. Negative for dizziness, syncope, weakness and headaches.     Objective:     Physical Exam Vitals and nursing note reviewed.  Constitutional:      Appearance: Normal appearance.  HENT:     Right Ear: Tympanic membrane, ear canal and external ear normal. There is no impacted cerumen.     Left Ear: Tympanic membrane, ear canal and external ear normal. There is no impacted cerumen.     Mouth/Throat:     Mouth: Mucous membranes are moist.     Pharynx: Oropharynx is clear.  Eyes:     Extraocular Movements: Extraocular movements intact.     Pupils: Pupils are equal, round, and reactive to light.  Neck:     Thyroid: No thyroid mass, thyromegaly or thyroid tenderness.  Cardiovascular:     Rate and Rhythm: Normal rate and regular rhythm.     Pulses:          Dorsalis pedis pulses are 2+ on the right side and 2+ on the left side.  Pulmonary:     Effort: Pulmonary effort is normal.     Breath sounds: Normal breath sounds.  Abdominal:     General: Bowel sounds are normal. There is no distension.     Palpations: There is no mass.     Tenderness: There is no abdominal tenderness.  Musculoskeletal:     Right lower leg: No edema.     Left lower leg: No edema.  Lymphadenopathy:     Cervical: No cervical adenopathy.  Neurological:     Mental Status: She is alert.     Motor: No weakness.     Coordination: Coordination normal.     Gait: Gait normal.     Deep Tendon Reflexes: Reflexes normal.     Reflex Scores:      Bicep reflexes are 1+ on the right side and 1+ on the left side.      Patellar reflexes are 2+ on the right side and 2+ on the left side.    Comments: Bilateral upper and lower extremities 5/5 strength Cranial nerves II-XII grossly intact    BP 120/84   Pulse 72   Temp 98.2 F (36.8 C)   Resp 10   Ht 5' 5.25" (1.657 m)   Wt 176 lb 8 oz (80.1 kg)   SpO2 96%   BMI 29.15 kg/m  Wt Readings from Last 3 Encounters:  06/21/21 176 lb 8 oz (80.1 kg)  10/02/20 166 lb 8 oz (75.5 kg)  02/10/20 170 lb (77.1 kg)     Health Maintenance Due  Topic  Date Due   HIV Screening  Never done   Hepatitis C Screening  Never done   Zoster Vaccines- Shingrix (1 of 2) Never done   TETANUS/TDAP  09/30/2016  PAP SMEAR-Modifier  10/01/2019    There are no preventive care reminders to display for this patient.  Lab Results  Component Value Date   TSH 1.27 04/07/2019   No results found for: WBC, HGB, HCT, MCV, PLT Lab Results  Component Value Date   NA 138 04/07/2019   K 4.1 04/07/2019   CO2 28 04/07/2019   GLUCOSE 87 04/07/2019   BUN 11 04/07/2019   CREATININE 1.00 04/07/2019   BILITOT 1.0 04/07/2019   ALKPHOS 70 04/07/2019   AST 62 (H) 04/07/2019   ALT 54 (H) 04/07/2019   PROT 7.7 04/07/2019   ALBUMIN 4.4 04/07/2019   CALCIUM 9.3 04/07/2019   GFR 70.22 04/07/2019   Lab Results  Component Value Date   CHOL 141 04/07/2019   Lab Results  Component Value Date   HDL 47.70 04/07/2019   Lab Results  Component Value Date   LDLCALC 81 04/07/2019   Lab Results  Component Value Date   TRIG 62.0 04/07/2019   Lab Results  Component Value Date   CHOLHDL 3 04/07/2019   No results found for: HGBA1C    Assessment & Plan:   Problem List Items Addressed This Visit       Cardiovascular and Mediastinum   Orthostatic hypotension    Patient has an appreciable greater than 20 mmHg drop systolically from lying to sitting.  Mild symptoms.  Patient states she has not been drinking a whole lot.  We did discuss encouraged water intake and some Gatorade intake to help.  Also making sure patient is eating.  Patient agreed to these recommendations.        Other   Preventative health care - Primary    Discussed age-appropriate preventative exams.  Appropriate referrals and orders placed.  Pending lab results.      Relevant Orders   CBC   Comprehensive metabolic panel   Hemoglobin A1c   TSH   Lipid panel   Ambulatory referral to Gynecology   Other Visit Diagnoses     Encounter for vitamin deficiency screening       Relevant  Orders   VITAMIN D 25 Hydroxy (Vit-D Deficiency, Fractures)   Encounter for screening mammogram for malignant neoplasm of breast       Relevant Orders   HM MAMMOGRAPHY (Completed)       No orders of the defined types were placed in this encounter.   Follow-up: Return in about 1 year (around 06/21/2022) for CPE and labs.  This visit occurred during the SARS-CoV-2 public health emergency.  Safety protocols were in place, including screening questions prior to the visit, additional usage of staff PPE, and extensive cleaning of exam room while observing appropriate contact time as indicated for disinfecting solutions.     Audria Nine, NP

## 2021-06-21 NOTE — Assessment & Plan Note (Signed)
Patient has an appreciable greater than 20 mmHg drop systolically from lying to sitting.  Mild symptoms.  Patient states she has not been drinking a whole lot.  We did discuss encouraged water intake and some Gatorade intake to help.  Also making sure patient is eating.  Patient agreed to these recommendations.

## 2021-06-21 NOTE — Assessment & Plan Note (Signed)
Discussed age-appropriate preventative exams.  Appropriate referrals and orders placed.  Pending lab results.

## 2021-06-21 NOTE — Patient Instructions (Signed)
Nice to see you today Will be in touch with labs See you in a year, sooner If you need me

## 2021-06-22 ENCOUNTER — Other Ambulatory Visit: Payer: Self-pay | Admitting: Nurse Practitioner

## 2021-06-22 DIAGNOSIS — R7989 Other specified abnormal findings of blood chemistry: Secondary | ICD-10-CM

## 2021-06-22 LAB — COMPREHENSIVE METABOLIC PANEL
ALT: 51 U/L — ABNORMAL HIGH (ref 0–35)
AST: 65 U/L — ABNORMAL HIGH (ref 0–37)
Albumin: 4.2 g/dL (ref 3.5–5.2)
Alkaline Phosphatase: 63 U/L (ref 39–117)
BUN: 13 mg/dL (ref 6–23)
CO2: 29 mEq/L (ref 19–32)
Calcium: 9.5 mg/dL (ref 8.4–10.5)
Chloride: 104 mEq/L (ref 96–112)
Creatinine, Ser: 1.05 mg/dL (ref 0.40–1.20)
GFR: 60.01 mL/min (ref >60.00)
Glucose, Bld: 93 mg/dL (ref 70–99)
Potassium: 3.9 mEq/L (ref 3.5–5.1)
Sodium: 140 mEq/L (ref 135–145)
Total Bilirubin: 0.8 mg/dL (ref 0.2–1.2)
Total Protein: 7.7 g/dL (ref 6.0–8.3)

## 2021-06-22 LAB — LIPID PANEL
Cholesterol: 144 mg/dL (ref 0–200)
HDL: 45.5 mg/dL (ref >39.00)
LDL Cholesterol: 82 mg/dL (ref 0–99)
NonHDL: 98.04
Total CHOL/HDL Ratio: 3
Triglycerides: 80 mg/dL (ref 0.0–149.0)
VLDL: 16 mg/dL (ref 0.0–40.0)

## 2021-06-22 LAB — CBC
HCT: 37.4 % (ref 36.0–46.0)
Hemoglobin: 12.6 g/dL (ref 12.0–15.0)
MCHC: 33.7 g/dL (ref 30.0–36.0)
MCV: 91.2 fl (ref 78.0–100.0)
Platelets: 213 10*3/uL (ref 150.0–400.0)
RBC: 4.1 Mil/uL (ref 3.87–5.11)
RDW: 12.5 % (ref 11.5–15.5)
WBC: 3.2 10*3/uL — ABNORMAL LOW (ref 4.0–10.5)

## 2021-06-22 LAB — VITAMIN D 25 HYDROXY (VIT D DEFICIENCY, FRACTURES): VITD: 62.19 ng/mL (ref 30.00–100.00)

## 2021-06-22 LAB — TSH: TSH: 1.53 u[IU]/mL (ref 0.35–5.50)

## 2021-06-22 LAB — HEMOGLOBIN A1C: Hgb A1c MFr Bld: 5.1 % (ref 4.6–6.5)

## 2021-06-25 ENCOUNTER — Encounter: Payer: Self-pay | Admitting: Certified Nurse Midwife

## 2021-08-01 ENCOUNTER — Other Ambulatory Visit (HOSPITAL_COMMUNITY)
Admission: RE | Admit: 2021-08-01 | Discharge: 2021-08-01 | Disposition: A | Discharge status: Home / Self Care | Payer: 59 | Source: Ambulatory Visit | Attending: Obstetrics and Gynecology | Admitting: Obstetrics and Gynecology

## 2021-08-01 ENCOUNTER — Encounter: Payer: Self-pay | Admitting: Obstetrics and Gynecology

## 2021-08-01 ENCOUNTER — Ambulatory Visit (INDEPENDENT_AMBULATORY_CARE_PROVIDER_SITE_OTHER): Payer: 59 | Admitting: Obstetrics and Gynecology

## 2021-08-01 ENCOUNTER — Other Ambulatory Visit: Payer: Self-pay

## 2021-08-01 VITALS — BP 130/70 | HR 69 | Ht 65.25 in | Wt 181.6 lb

## 2021-08-01 DIAGNOSIS — Z7689 Persons encountering health services in other specified circumstances: Secondary | ICD-10-CM

## 2021-08-01 DIAGNOSIS — Z01419 Encounter for gynecological examination (general) (routine) without abnormal findings: Secondary | ICD-10-CM | POA: Insufficient documentation

## 2021-08-01 DIAGNOSIS — T8332XA Displacement of intrauterine contraceptive device, initial encounter: Secondary | ICD-10-CM | POA: Diagnosis not present

## 2021-08-01 NOTE — Progress Notes (Signed)
NP Present to est care with a new provider. Pt denies any gyn issues at this time.

## 2021-08-01 NOTE — Progress Notes (Signed)
HPI:      [name] is a 55 y.o. [id number] who LMP was No LMP recorded (lmp unknown). (Menstrual status: IUD).  Subjective:   She presents today for her annual examination.  She has no complaints.  She says that after COVID happened she stopped going to the doctor for a few years and is now due for a Pap smear and mammogram. Of significant note patient has a a recent reduction mammoplasty. She has a remote history of abnormal Pap smear requiring LEEP. She has a Mirena IUD in place.  She states it has been there for more than 9 years.  She is not having periods. She denies hot flashes or night sweats.    Hx: The following portions of the patient's history were reviewed and updated as appropriate:             She  has a past medical history of H/O LEEP, History of C-section, Macromastia, and SVD (spontaneous vaginal delivery). She does not have any pertinent problems on file. She  has a past surgical history that includes Wisdom tooth extraction; Cesarean section (2008); and Breast reduction surgery (Bilateral, 12/20/2019). Her family history includes Dementia in her mother; Hypertension in her paternal grandmother. She  reports that she has never smoked. She has never used smokeless tobacco. She reports current alcohol use. She reports that she does not use drugs. She has a current medication list which includes the following prescription(s): vitamin c, vitamin d-3, and levonorgestrel. She has No Known Allergies.       Review of Systems:  Review of Systems  Constitutional: Denied constitutional symptoms, night sweats, recent illness, fatigue, fever, insomnia and weight loss.  Eyes: Denied eye symptoms, eye pain, photophobia, vision change and visual disturbance.  Ears/Nose/Throat/Neck: Denied ear, nose, throat or neck symptoms, hearing loss, nasal discharge, sinus congestion and sore throat.  Cardiovascular: Denied cardiovascular symptoms, arrhythmia, chest pain/pressure, edema,  exercise intolerance, orthopnea and palpitations.  Respiratory: Denied pulmonary symptoms, asthma, pleuritic pain, productive sputum, cough, dyspnea and wheezing.  Gastrointestinal: Denied, gastro-esophageal reflux, melena, nausea and vomiting.  Genitourinary: Denied genitourinary symptoms including symptomatic vaginal discharge, pelvic relaxation issues, and urinary complaints.  Musculoskeletal: Denied musculoskeletal symptoms, stiffness, swelling, muscle weakness and myalgia.  Dermatologic: Denied dermatology symptoms, rash and scar.  Neurologic: Denied neurology symptoms, dizziness, headache, neck pain and syncope.  Psychiatric: Denied psychiatric symptoms, anxiety and depression.  Endocrine: Denied endocrine symptoms including hot flashes and night sweats.   Meds:   Current Outpatient Medications on File Prior to Visit  Medication Sig Dispense Refill   Ascorbic Acid (VITAMIN C) 1000 MG tablet Take 1,000 mg by mouth daily.     Cholecalciferol (VITAMIN D-3) 125 MCG (5000 UT) TABS Take 1 tablet by mouth daily.     levonorgestrel (MIRENA) 20 MCG/24HR IUD 1 each by Intrauterine route once.     No current facility-administered medications on file prior to visit.      Objective:     Vitals:   08/01/21 1436  BP: 130/70  Pulse: 69    Filed Weights   08/01/21 1436  Weight: 181 lb 9.6 oz (82.4 kg)              Physical examination General NAD, Conversant  HEENT Atraumatic; Op clear with mmm.  Normo-cephalic. Pupils reactive. Anicteric sclerae  Thyroid/Neck Smooth without nodularity or enlargement. Normal ROM.  Neck Supple.  Skin No rashes, lesions or ulceration. Normal palpated skin turgor. No nodularity.  Breasts: No masses  or discharge.  Symmetric.  No axillary adenopathy.  Bilateral mammoplasty scars  Lungs: Clear to auscultation.No rales or wheezes. Normal Respiratory effort, no retractions.  Heart: NSR.  No murmurs or rubs appreciated. No periferal edema  Abdomen: Soft.   Non-tender.  No masses.  No HSM. No hernia  Extremities: Moves all appropriately.  Normal ROM for age. No lymphadenopathy.  Neuro: Oriented to PPT.  Normal mood. Normal affect.     Pelvic:   Vulva: Normal appearance.  No lesions.  Vagina: No lesions or abnormalities noted.  Support: Normal pelvic support.  Urethra No masses tenderness or scarring.  Meatus Normal size without lesions or prolapse.  Cervix: Normal appearance.  No lesions.  IUD strings not seen  Anus: Normal exam.  No lesions.  Perineum: Normal exam.  No lesions.        Bimanual   Uterus: Normal size.  Non-tender.  Mobile.  AV.  Adnexae: No masses.  Non-tender to palpation.  Cul-de-sac: Negative for abnormality.     Assessment:    [id number] Patient Active Problem List   Diagnosis Date Noted   Preventative health care 06/21/2021   Orthostatic hypotension 06/21/2021   S/P bilateral breast reduction 01/19/2020     1. Encounter to establish care with new doctor   2. Encounter for well woman exam with routine gynecological exam   3. Intrauterine contraceptive device threads lost, initial encounter     Patient possibly in menopause with amenorrhea and an IUD greater than 34 years old.   Plan:            1.  Basic Screening Recommendations The basic screening recommendations for asymptomatic women were discussed with the patient during her visit.  The age-appropriate recommendations were discussed with her and the rational for the tests reviewed.  When I am informed by the patient that another primary care physician has previously obtained the age-appropriate tests and they are up-to-date, only outstanding tests are ordered and referrals given as necessary.  Abnormal results of tests will be discussed with her when all of her results are completed.  Routine preventative health maintenance measures emphasized: Exercise/Diet/Weight control, Tobacco Warnings, Alcohol/Substance use risks and Stress Management Pap  performed-mammogram ordered 2.  FSH ordered to rule out menopause.  If patient in menopause we will discuss IUD removal at that time. Orders Orders Placed This Encounter  Procedures   MM DIGITAL SCREENING BILATERAL   Follicle stimulating hormone             F/U  Return in about 1 year (around 08/01/2022) for Annual Physical.  Elonda Husky, M.D. 08/01/2021 3:15 PM

## 2021-08-02 LAB — FOLLICLE STIMULATING HORMONE: FSH: 12 m[IU]/mL

## 2021-08-03 NOTE — Progress Notes (Signed)
Your FSH test indicates that you are not yet in menopause.  Because your IUD is greater than 55 years old I would recommend removal and replacement. Your strings are not seen at your exam and it is likely they are curled just within the cervix.  We can attempt to find them at your next visit or prior to that visit we can schedule an ultrasound to confirm the location of your IUD.  Which ever you prefer.

## 2021-08-06 LAB — CYTOLOGY - PAP
Comment: NEGATIVE
Diagnosis: NEGATIVE
High risk HPV: NEGATIVE

## 2021-08-17 NOTE — Progress Notes (Signed)
Tried to call patient in regards to labs. If patient calls back please review lab message with her.

## 2021-08-30 ENCOUNTER — Other Ambulatory Visit: Payer: Self-pay

## 2021-08-30 ENCOUNTER — Ambulatory Visit (INDEPENDENT_AMBULATORY_CARE_PROVIDER_SITE_OTHER): Payer: 59 | Admitting: Obstetrics and Gynecology

## 2021-08-30 ENCOUNTER — Encounter: Payer: Self-pay | Admitting: Obstetrics and Gynecology

## 2021-08-30 VITALS — BP 124/89 | HR 74 | Ht 65.0 in | Wt 181.6 lb

## 2021-08-30 DIAGNOSIS — Z30432 Encounter for removal of intrauterine contraceptive device: Secondary | ICD-10-CM

## 2021-08-30 DIAGNOSIS — Z30430 Encounter for insertion of intrauterine contraceptive device: Secondary | ICD-10-CM

## 2021-08-30 DIAGNOSIS — Z30433 Encounter for removal and reinsertion of intrauterine contraceptive device: Secondary | ICD-10-CM | POA: Diagnosis not present

## 2021-08-30 NOTE — Progress Notes (Addendum)
HPI:      [name] is a 55 y.o. [id number] who LMP was No LMP recorded (lmp unknown). (Menstrual status: IUD).  Subjective:   She presents today for IUD removal and reinsertion.  She has a remote history of LEEP.  At her last examination her IUD strings were not visible.  She declined ultrasound to attempt to identify the IUD. She had a recent [facility] revealing that she is not yet in menopause.    Hx: The following portions of the patient's history were reviewed and updated as appropriate:             She  has a past medical history of H/O LEEP, History of C-section, Macromastia, and SVD (spontaneous vaginal delivery). She does not have any pertinent problems on file. She  has a past surgical history that includes Wisdom tooth extraction; Cesarean section (2008); and Breast reduction surgery (Bilateral, 12/20/2019). Her family history includes Dementia in her mother; Hypertension in her paternal grandmother. She  reports that she has never smoked. She has never used smokeless tobacco. She reports current alcohol use. She reports that she does not use drugs. She has a current medication list which includes the following prescription(s): vitamin c, vitamin d-3, and levonorgestrel. She has No Known Allergies.       Review of Systems:  Review of Systems  Constitutional: Denied constitutional symptoms, night sweats, recent illness, fatigue, fever, insomnia and weight loss.  Eyes: Denied eye symptoms, eye pain, photophobia, vision change and visual disturbance.  Ears/Nose/Throat/Neck: Denied ear, nose, throat or neck symptoms, hearing loss, nasal discharge, sinus congestion and sore throat.  Cardiovascular: Denied cardiovascular symptoms, arrhythmia, chest pain/pressure, edema, exercise intolerance, orthopnea and palpitations.  Respiratory: Denied pulmonary symptoms, asthma, pleuritic pain, productive sputum, cough, dyspnea and wheezing.  Gastrointestinal: Denied, gastro-esophageal reflux,  melena, nausea and vomiting.  Genitourinary: Denied genitourinary symptoms including symptomatic vaginal discharge, pelvic relaxation issues, and urinary complaints.  Musculoskeletal: Denied musculoskeletal symptoms, stiffness, swelling, muscle weakness and myalgia.  Dermatologic: Denied dermatology symptoms, rash and scar.  Neurologic: Denied neurology symptoms, dizziness, headache, neck pain and syncope.  Psychiatric: Denied psychiatric symptoms, anxiety and depression.  Endocrine: Denied endocrine symptoms including hot flashes and night sweats.   Meds:   Current Outpatient Medications on File Prior to Visit  Medication Sig Dispense Refill   Ascorbic Acid (VITAMIN C) 1000 MG tablet Take 1,000 mg by mouth daily.     Cholecalciferol (VITAMIN D-3) 125 MCG (5000 UT) TABS Take 1 tablet by mouth daily.     levonorgestrel (MIRENA) 20 MCG/24HR IUD 1 each by Intrauterine route once.     No current facility-administered medications on file prior to visit.    Objective:     Vitals:   08/30/21 0957  BP: 124/89  Pulse: 74    Physical examination   Pelvic:   Vulva: Normal appearance.  No lesions.  Vagina: No lesions or abnormalities noted.  Support: Normal pelvic support.  Urethra No masses tenderness or scarring.  Meatus Normal size without lesions or prolapse.  Cervix: Significant cervical stenosis-IUD strings not visible.  Anus: Normal exam.  No lesions.  Perineum: Normal exam.  No lesions.        Bimanual   Uterus: Normal size.  Non-tender.  Mobile.  AV.  Adnexae: No masses.  Non-tender to palpation.  Cul-de-sac: Negative for abnormality.   Using the smallest dilator we have I was able to gently inserted into the cervical os.  I then systematically dilated the  cervix with multiple dilators to allow the passage of a tonsil clamp.  The strings were identified deep within the endometrial cavity.  IUD Removal Strings of IUD identified and grasped.  IUD removed without problem.  Pt  tolerated this well.  IUD noted to be intact.  IUD Procedure Pt has read the booklet and signed the appropriate forms regarding the Mirena IUD.  All of her questions have been answered.   The cervix was cleansed with betadine solution.  After sounding the uterus and noting the position, the IUD was placed in the usual manner without problem.  The string was cut to the appropriate length.  The patient tolerated the procedure well.         [facility] # = [id number]      Assessment:    [id number] Patient Active Problem List   Diagnosis Date Noted   Preventative health care 06/21/2021   Orthostatic hypotension 06/21/2021   S/P bilateral breast reduction 01/19/2020     1. Encounter for insertion of mirena IUD   2. Encounter for IUD removal       Plan:             F/U  Return in about 4 weeks (around 09/27/2021) for For IUD f/u.  Finis Bud, M.D. 08/30/2021 10:50 AM

## 2021-08-30 NOTE — Progress Notes (Signed)
Patient presents for Mirena IUD removal and Mirena Insertion. Patient has no complaints or concerns at this time.

## 2021-09-25 ENCOUNTER — Other Ambulatory Visit: Payer: 59

## 2021-09-27 ENCOUNTER — Encounter: Payer: 59 | Admitting: Obstetrics and Gynecology

## 2021-10-17 ENCOUNTER — Encounter: Payer: 59 | Admitting: Obstetrics and Gynecology

## 2021-10-18 ENCOUNTER — Encounter: Payer: Self-pay | Admitting: Obstetrics and Gynecology

## 2021-10-18 ENCOUNTER — Ambulatory Visit (INDEPENDENT_AMBULATORY_CARE_PROVIDER_SITE_OTHER): Payer: 59 | Admitting: Obstetrics and Gynecology

## 2021-10-18 ENCOUNTER — Other Ambulatory Visit: Payer: Self-pay

## 2021-10-18 VITALS — BP 129/70 | HR 84 | Ht 66.0 in | Wt 181.0 lb

## 2021-10-18 DIAGNOSIS — Z30431 Encounter for routine checking of intrauterine contraceptive device: Secondary | ICD-10-CM

## 2021-10-18 NOTE — Progress Notes (Signed)
HPI:      [name] is a 56 y.o. [id number] who LMP was No LMP recorded. (Menstrual status: IUD).  Subjective:   She presents today for follow-up of IUD placement.  She states that she had some bleeding after IUD placement but this has now resolved.  She is happy with the IUD and not having any issues.  She is not currently sexually active.    Hx: The following portions of the patient's history were reviewed and updated as appropriate:             She  has a past medical history of H/O LEEP, History of C-section, Macromastia, and SVD (spontaneous vaginal delivery). She does not have any pertinent problems on file. She  has a past surgical history that includes Wisdom tooth extraction; Cesarean section (2008); and Breast reduction surgery (Bilateral, 12/20/2019). Her family history includes Dementia in her mother; Hypertension in her paternal grandmother. She  reports that she has never smoked. She has never used smokeless tobacco. She reports current alcohol use. She reports that she does not use drugs. She has a current medication list which includes the following prescription(s): vitamin c, vitamin d-3, and levonorgestrel. She has No Known Allergies.       Review of Systems:  Review of Systems  Constitutional: Denied constitutional symptoms, night sweats, recent illness, fatigue, fever, insomnia and weight loss.  Eyes: Denied eye symptoms, eye pain, photophobia, vision change and visual disturbance.  Ears/Nose/Throat/Neck: Denied ear, nose, throat or neck symptoms, hearing loss, nasal discharge, sinus congestion and sore throat.  Cardiovascular: Denied cardiovascular symptoms, arrhythmia, chest pain/pressure, edema, exercise intolerance, orthopnea and palpitations.  Respiratory: Denied pulmonary symptoms, asthma, pleuritic pain, productive sputum, cough, dyspnea and wheezing.  Gastrointestinal: Denied, gastro-esophageal reflux, melena, nausea and vomiting.  Genitourinary: Denied  genitourinary symptoms including symptomatic vaginal discharge, pelvic relaxation issues, and urinary complaints.  Musculoskeletal: Denied musculoskeletal symptoms, stiffness, swelling, muscle weakness and myalgia.  Dermatologic: Denied dermatology symptoms, rash and scar.  Neurologic: Denied neurology symptoms, dizziness, headache, neck pain and syncope.  Psychiatric: Denied psychiatric symptoms, anxiety and depression.  Endocrine: Denied endocrine symptoms including hot flashes and night sweats.   Meds:   Current Outpatient Medications on File Prior to Visit  Medication Sig Dispense Refill   Ascorbic Acid (VITAMIN C) 1000 MG tablet Take 1,000 mg by mouth daily.     Cholecalciferol (VITAMIN D-3) 125 MCG (5000 UT) TABS Take 1 tablet by mouth daily.     levonorgestrel (MIRENA) 20 MCG/24HR IUD 1 each by Intrauterine route once.     No current facility-administered medications on file prior to visit.      Objective:     Vitals:   10/18/21 1540  BP: 129/70  Pulse: 84  SpO2: 97%   Filed Weights   10/18/21 1540  Weight: 181 lb (82.1 kg)              Physical examination   Pelvic:   Vulva: Normal appearance.  No lesions.  Vagina: No lesions or abnormalities noted.  Support: Normal pelvic support.  Urethra No masses tenderness or scarring.  Meatus Normal size without lesions or prolapse.  Cervix: Normal appearance.  No lesions. IUD strings noted at cervical os.  Anus: Normal exam.  No lesions.  Perineum: Normal exam.  No lesions.        Bimanual   Uterus: Normal size.  Non-tender.  Mobile.  AV.  Adnexae: No masses.  Non-tender to palpation.  Cul-de-sac: Negative for  abnormality.             Assessment:    [id number] Patient Active Problem List   Diagnosis Date Noted   Preventative health care 06/21/2021   Orthostatic hypotension 06/21/2021   S/P bilateral breast reduction 01/19/2020     1. Surveillance of previously prescribed intrauterine contraceptive device         Plan:            1.  Patient doing well with IUD.  We will plan follow-up for annual examination.  Patient will likely be menopausal in the near future based on age. (Recent FSH revealed not yet in menopause) Orders No orders of the defined types were placed in this encounter.   No orders of the defined types were placed in this encounter.     F/U  Return in about 10 months (around 08/18/2022) for Annual Physical. I spent 15 minutes involved in the care of this patient preparing to see the patient by obtaining and reviewing her medical history (including labs, imaging tests and prior procedures), documenting clinical information in the electronic health record (EHR), counseling and coordinating care plans, writing and sending prescriptions, ordering tests or procedures and in direct communicating with the patient and medical staff discussing pertinent items from her history and physical exam.  Finis Bud, M.D. 10/18/2021 3:55 PM

## 2022-03-09 ENCOUNTER — Ambulatory Visit (INDEPENDENT_AMBULATORY_CARE_PROVIDER_SITE_OTHER): Payer: 59

## 2022-03-09 ENCOUNTER — Ambulatory Visit
Admission: EM | Admit: 2022-03-09 | Discharge: 2022-03-09 | Disposition: A | Discharge status: Home / Self Care | Payer: 59 | Attending: Emergency Medicine | Admitting: Emergency Medicine

## 2022-03-09 ENCOUNTER — Encounter: Payer: Self-pay | Admitting: Emergency Medicine

## 2022-03-09 DIAGNOSIS — M533 Sacrococcygeal disorders, not elsewhere classified: Secondary | ICD-10-CM

## 2022-03-09 NOTE — ED Provider Notes (Addendum)
[facility]   MRN: [id number] DOB: 01-30-1966  Subjective:   Chief Complaint;  Chief Complaint  Patient presents with   Tailbone Pain  Pt reports that on Wednesday she fell down her steps.She missed a step and landed on her butt all the way down her stairs. No bruising, but pain on ambulation and sitting  Marcia Newman is a 56 y.o. female presenting for tailbone pain status post fall 4 days ago.  Patient denies head, neck, back injury or pain.  It is painful to walk in her regular gait and painful to sit.  Patient has noticed no bruising in the area  No current facility-administered medications for this encounter.  Current Outpatient Medications:    Ascorbic Acid (VITAMIN C) 1000 MG tablet, Take 1,000 mg by mouth daily., Disp: , Rfl:    Cholecalciferol (VITAMIN D-3) 125 MCG (5000 UT) TABS, Take 1 tablet by mouth daily., Disp: , Rfl:    levonorgestrel (MIRENA) 20 MCG/24HR IUD, 1 each by Intrauterine route once., Disp: , Rfl:    No Known Allergies  Past Medical History:  Diagnosis Date   H/O LEEP    History of C-section    Macromastia    bil   SVD (spontaneous vaginal delivery)    x 4, 1 stillborn delivery     Review of Systems  All other systems reviewed and are negative.    Objective:   Vitals: BP 132/87 (BP Location: Left Arm)   Pulse 70   Temp 98.7 F (37.1 C) (Oral)   Resp 18   Ht 5\' 6"  (1.676 m)   Wt 176 lb 6.4 oz (80 kg)   SpO2 97%   BMI 28.47 kg/m   Physical Exam Vitals and nursing note reviewed.  Constitutional:      General: She is not in acute distress.    Appearance: Normal appearance. She is well-developed.     Comments: Appears mildly uncomfortable  HENT:     Head: Normocephalic and atraumatic.  Eyes:     Conjunctiva/sclera: Conjunctivae normal.  Cardiovascular:     Rate and Rhythm: Normal rate and regular rhythm.     Heart sounds: No murmur heard. Pulmonary:     Effort: Pulmonary effort is normal. No respiratory  distress.     Breath sounds: Normal breath sounds.  Abdominal:     Palpations: Abdomen is soft.     Tenderness: There is no abdominal tenderness.  Genitourinary:    Comments: Tenderness to palpation over coccyx Musculoskeletal:        General: No swelling.     Cervical back: Neck supple.     Right lower leg: No edema.     Left lower leg: Edema present.     Comments: No midline spine tenderness cervical, thoracic or lumbar.  No NVD D or notable lower extremity edema  Skin:    General: Skin is warm and dry.     Capillary Refill: Capillary refill takes less than 2 seconds.     Findings: No bruising.  Neurological:     Mental Status: She is alert.  Psychiatric:        Mood and Affect: Mood normal.     No results found for this or any previous visit (from the past 24 hour(s)).  DG Sacrum/Coccyx  Result Date: 03/09/2022 CLINICAL DATA:  fall onto buttocks 4 days ago- r/o fracture EXAM: SACRUM AND COCCYX - 2+ VIEW COMPARISON:  None Available. FINDINGS: There is no evidence of acute  fracture. Alignment is normal. Degenerative disc disease at L4-L5. Lower lumbar predominant facet arthropathy. There is an IUD overlying the pelvis. IMPRESSION: No acute osseous abnormality. Electronically Signed   By: Caprice Renshaw M.D.   On: 03/09/2022 09:29   DG Lumbar Spine Complete  Result Date: 03/09/2022 CLINICAL DATA:  fell onto buttocks 4 days ago- r/o fracture EXAM: LUMBAR SPINE - COMPLETE 4+ VIEW COMPARISON:  None Available. FINDINGS: There are 5 non-rib-bearing lumbar vertebrae. There is no evidence of lumbar spine fracture. There is mild degenerative disc disease at L4-L5 with facet arthropathy and grade 1 anterolisthesis at this level. Mild to moderate lower lumbar predominant facet arthropathy. IMPRESSION: No evidence of lumbar spine fracture. Mild degenerative disc disease at L4-L5 with grade 1 anterolisthesis. Mild to moderate lower lumbar predominant facet arthropathy. Electronically Signed   By:  Caprice Renshaw M.D.   On: 03/09/2022 09:28       Assessment and Plan :   1. Coccyx pain     No orders of the defined types were placed in this encounter.   MDM:  Marcia Newman is a 56 y.o. female presenting for tailbone pain status post fall 4 days ago, her pain has been persistent which brought her in today.  The exam is positive only for tenderness in the coccyx region no other midline spine tenderness was noted.  X-ray was negative for evidence of acute fracture.  Patient is encouraged to take Aleve twice daily along with Tylenol Extra Strength 2 tablets every 6 hours for pain relief and follow-up with your PCP.  I discussed treatment, follow up and return instructions. Questions were answered. Patient/representative stated understanding of instructions and patient is stable for discharge.  Dewaine Conger FNP-C MCN    Jone Baseman, NP 03/09/22 0934    Jone Baseman, NP 03/09/22 [phone]

## 2022-03-09 NOTE — Discharge Instructions (Addendum)
Continue Aleve 2 AM and p.m. Tylenol Extra Strength 2 tablets every 6 hours.  Warm Epsom salt soaks may be helpful.  I will call you with the radiologist final read when received today, I cannot rule out versus old coccyx fracture

## 2022-03-09 NOTE — ED Triage Notes (Signed)
Pt reports that on Wednesday she fell down her steps.She missed a step and landed on her butt all the way down her stairs. No bruising, but pain and difficulty sitting.

## 2022-05-07 ENCOUNTER — Encounter: Payer: 59 | Admitting: Obstetrics and Gynecology

## 2022-05-07 DIAGNOSIS — Z30431 Encounter for routine checking of intrauterine contraceptive device: Secondary | ICD-10-CM

## 2022-05-22 ENCOUNTER — Encounter: Payer: 59 | Admitting: Obstetrics and Gynecology

## 2022-05-22 DIAGNOSIS — Z30431 Encounter for routine checking of intrauterine contraceptive device: Secondary | ICD-10-CM

## 2022-06-04 ENCOUNTER — Encounter: Payer: 59 | Admitting: Obstetrics and Gynecology

## 2022-06-04 DIAGNOSIS — Z30431 Encounter for routine checking of intrauterine contraceptive device: Secondary | ICD-10-CM

## 2022-06-27 ENCOUNTER — Telehealth: Payer: Self-pay | Admitting: Nurse Practitioner

## 2022-06-27 NOTE — Telephone Encounter (Signed)
Time for patient to have her next physical. Can we get it scheduled please

## 2022-06-27 NOTE — Telephone Encounter (Signed)
LVM for patient to call and schedule

## 2022-08-12 ENCOUNTER — Encounter: Payer: Self-pay | Admitting: Nurse Practitioner

## 2022-08-12 ENCOUNTER — Ambulatory Visit (INDEPENDENT_AMBULATORY_CARE_PROVIDER_SITE_OTHER): Payer: 59 | Admitting: Nurse Practitioner

## 2022-08-12 VITALS — BP 130/72 | HR 73 | Temp 97.1°F | Resp 12 | Ht 66.0 in | Wt 177.4 lb

## 2022-08-12 DIAGNOSIS — E663 Overweight: Secondary | ICD-10-CM | POA: Diagnosis not present

## 2022-08-12 DIAGNOSIS — R0982 Postnasal drip: Secondary | ICD-10-CM | POA: Diagnosis not present

## 2022-08-12 DIAGNOSIS — Z0000 Encounter for general adult medical examination without abnormal findings: Secondary | ICD-10-CM | POA: Diagnosis not present

## 2022-08-12 DIAGNOSIS — Z1321 Encounter for screening for nutritional disorder: Secondary | ICD-10-CM | POA: Diagnosis not present

## 2022-08-12 DIAGNOSIS — Z23 Encounter for immunization: Secondary | ICD-10-CM | POA: Diagnosis not present

## 2022-08-12 MED ORDER — FLUTICASONE PROPIONATE 50 MCG/ACT NA SUSP: 2.0000 | Freq: Every day | NASAL | 0 refills | Status: AC

## 2022-08-12 NOTE — Progress Notes (Signed)
Established Patient Office Visit  Subjective   Patient ID: Marcia Newman, female    DOB: August 07, 1966  Age: 56 y.o. MRN: [id number]  Chief Complaint  Patient presents with   Annual Exam    Has obgyn    HPI  for complete physical and follow up of chronic conditions.  Immunizations: -Tetanus: Update today -Influenza: Update today -Shingles: Discussed information in office -Pneumonia: Too young  -HPV: Aged out  Diet: Fair diet. 2 meals a day. States taht she will eat breakfast every mroning. Sometimes she will do lunch and then dinner. No snacks. Will do coffee in the morning some morning and water Exercise: No regular exercise.  Eye exam:  reading PRN Dental exam: Completes semi-annually   Pap Smear: Completed in 08/01/2021 Dr. Brennan Bailey Mammogram: Completed in 2021  Colonoscopy: Completed in 2020, recall 2030 Lung Cancer Screening: N/A  Dexa: Does not qualify  Sleep: Goes to bed around 830-9 and will get up at 5am for work. Feels rested. Does not snore    Review of Systems  Constitutional:  Negative for chills and fever.  HENT:  Positive for congestion and sore throat. Negative for ear discharge, ear pain and sinus pain.   Respiratory:  Negative for cough and shortness of breath.   Cardiovascular:  Negative for chest pain.  Gastrointestinal:  Negative for abdominal pain, blood in stool, constipation, diarrhea, nausea and vomiting.       BM every other day   Genitourinary:  Negative for dysuria and hematuria.  Neurological:  Negative for tingling and headaches.  Psychiatric/Behavioral:  Negative for hallucinations and suicidal ideas.       Objective:     BP 130/72   Pulse 73   Temp (!) 97.1 F (36.2 C) (Temporal)   Resp 12   Ht 5\' 6"  (1.676 m)   Wt 177 lb 6 oz (80.5 kg)   SpO2 99%   BMI 28.63 kg/m    Physical Exam Vitals and nursing note reviewed.  Constitutional:      Appearance: Normal appearance.  HENT:     Right Ear: Tympanic membrane, ear  canal and external ear normal.     Left Ear: Tympanic membrane, ear canal and external ear normal.     Mouth/Throat:     Mouth: Mucous membranes are moist.     Pharynx: Oropharynx is clear. No posterior oropharyngeal erythema.  Eyes:     Extraocular Movements: Extraocular movements intact.     Pupils: Pupils are equal, round, and reactive to light.  Cardiovascular:     Rate and Rhythm: Normal rate and regular rhythm.     Pulses: Normal pulses.     Heart sounds: Normal heart sounds.  Pulmonary:     Effort: Pulmonary effort is normal.     Breath sounds: Normal breath sounds.  Abdominal:     General: Bowel sounds are normal. There is no distension.     Palpations: There is no mass.     Tenderness: There is no abdominal tenderness.     Hernia: No hernia is present.  Musculoskeletal:     Right lower leg: No edema.     Left lower leg: No edema.  Lymphadenopathy:     Cervical: No cervical adenopathy.  Skin:    General: Skin is warm.  Neurological:     General: No focal deficit present.     Mental Status: She is alert.     Deep Tendon Reflexes:     Reflex Scores:  Bicep reflexes are 2+ on the right side and 2+ on the left side.      Patellar reflexes are 2+ on the right side and 2+ on the left side.    Comments: Bilateral upper and lower extremity strength 5/5  Psychiatric:        Mood and Affect: Mood normal.        Behavior: Behavior normal.        Thought Content: Thought content normal.        Judgment: Judgment normal.      No results found for any visits on 08/12/22.    The 10-year ASCVD risk score (Arnett DK, et al., 2019) is: 3.3%    Assessment & Plan:   Problem List Items Addressed This Visit       Other   Preventative health care - Primary    Discussed age-appropriate immunizations and screening exams.  Patient was given information at discharge about preventative healthcare maintenance with anticipatory guidance with her age range.  Patient is  up-to-date with colonoscopy.  Orders for mammogram already in the system      Relevant Orders   CBC   Comprehensive metabolic panel   Hemoglobin A1c   TSH   VITAMIN D 25 Hydroxy (Vit-D Deficiency, Fractures)   Lipid panel   Overweight    Continue working on healthy lifestyle modifications.  Did encourage exercise of 30 minutes a day 5 times a week.  Patient can start slower and work-related.      Relevant Orders   Hemoglobin A1c   Lipid panel   PND (post-nasal drip)    Postnasal drip we will try fluticasone 2 sprays each nostril daily.  Follow-up if no improvement or symptoms worsen      Relevant Medications   fluticasone (FLONASE) 50 MCG/ACT nasal spray   Other Visit Diagnoses     Encounter for vitamin deficiency screening       Relevant Orders   VITAMIN D 25 Hydroxy (Vit-D Deficiency, Fractures)   Need for tetanus, diphtheria, and acellular pertussis (Tdap) vaccine       Relevant Orders   Tdap vaccine greater than or equal to 7yo IM (Completed)   Need for influenza vaccination       Relevant Orders   Flu Vaccine QUAD 6+ mos PF IM (Fluarix Quad PF) (Completed)       Return in about 1 year (around 08/13/2023) for CPE and Labs.    Audria Nine, NP

## 2022-08-12 NOTE — Assessment & Plan Note (Addendum)
Discussed age-appropriate immunizations and screening exams.  Patient was given information at discharge about preventative healthcare maintenance with anticipatory guidance with her age range.  Patient is up-to-date with colonoscopy.  Orders for mammogram already in the system

## 2022-08-12 NOTE — Assessment & Plan Note (Signed)
Postnasal drip we will try fluticasone 2 sprays each nostril daily.  Follow-up if no improvement or symptoms worsen

## 2022-08-12 NOTE — Assessment & Plan Note (Signed)
Continue working on healthy lifestyle modifications.  Did encourage exercise of 30 minutes a day 5 times a week.  Patient can start slower and work-related.

## 2022-08-12 NOTE — Patient Instructions (Signed)
Nice to see you today I will be in touch with the labs once I have the results Work on exercise. We recommend that you do 30 mins of exercise a day 5 days a week. Start slow and start at like 15 mins a day twice a week Follow up with me in 1 year for your next physical, sooner if you need me

## 2022-08-13 LAB — LIPID PANEL
Cholesterol: 142 mg/dL (ref 0–200)
HDL: 42.3 mg/dL (ref >39.00)
LDL Cholesterol: 72 mg/dL (ref 0–99)
NonHDL: 100.05
Total CHOL/HDL Ratio: 3
Triglycerides: 139 mg/dL (ref 0.0–149.0)
VLDL: 27.8 mg/dL (ref 0.0–40.0)

## 2022-08-13 LAB — HEMOGLOBIN A1C: Hgb A1c MFr Bld: 5.3 % (ref 4.6–6.5)

## 2022-08-13 LAB — CBC
HCT: 36.6 % (ref 36.0–46.0)
Hemoglobin: 12.2 g/dL (ref 12.0–15.0)
MCHC: 33.3 g/dL (ref 30.0–36.0)
MCV: 92.5 fl (ref 78.0–100.0)
Platelets: 179 10*3/uL (ref 150.0–400.0)
RBC: 3.96 Mil/uL (ref 3.87–5.11)
RDW: 12.8 % (ref 11.5–15.5)
WBC: 3 10*3/uL — ABNORMAL LOW (ref 4.0–10.5)

## 2022-08-13 LAB — COMPREHENSIVE METABOLIC PANEL
ALT: 57 U/L — ABNORMAL HIGH (ref 0–35)
AST: 71 U/L — ABNORMAL HIGH (ref 0–37)
Albumin: 4.2 g/dL (ref 3.5–5.2)
Alkaline Phosphatase: 60 U/L (ref 39–117)
BUN: 14 mg/dL (ref 6–23)
CO2: 29 mEq/L (ref 19–32)
Calcium: 9.3 mg/dL (ref 8.4–10.5)
Chloride: 104 mEq/L (ref 96–112)
Creatinine, Ser: 0.99 mg/dL (ref 0.40–1.20)
GFR: 63.88 mL/min (ref >60.00)
Glucose, Bld: 85 mg/dL (ref 70–99)
Potassium: 4.3 mEq/L (ref 3.5–5.1)
Sodium: 140 mEq/L (ref 135–145)
Total Bilirubin: 0.9 mg/dL (ref 0.2–1.2)
Total Protein: 7.5 g/dL (ref 6.0–8.3)

## 2022-08-13 LAB — TSH: TSH: 1.4 u[IU]/mL (ref 0.35–5.50)

## 2022-08-13 LAB — VITAMIN D 25 HYDROXY (VIT D DEFICIENCY, FRACTURES): VITD: 68.56 ng/mL (ref 30.00–100.00)

## 2022-08-19 ENCOUNTER — Telehealth: Payer: Self-pay | Admitting: Nurse Practitioner

## 2022-08-19 DIAGNOSIS — R7989 Other specified abnormal findings of blood chemistry: Secondary | ICD-10-CM

## 2022-08-19 DIAGNOSIS — D72819 Decreased white blood cell count, unspecified: Secondary | ICD-10-CM

## 2022-08-19 NOTE — Telephone Encounter (Signed)
-----   Message from [facility], [place] sent at 08/19/2022 10:07 AM EST ----- Patient advised. Ok with [place] would like to go [employer]. Never seen hematologist, she is ok to go to hematologist- [place] location ok

## 2022-08-26 ENCOUNTER — Ambulatory Visit
Admission: RE | Admit: 2022-08-26 | Discharge: 2022-08-26 | Disposition: A | Discharge status: Home / Self Care | Payer: 59 | Source: Ambulatory Visit | Attending: Nurse Practitioner | Admitting: Nurse Practitioner

## 2022-08-26 DIAGNOSIS — R7989 Other specified abnormal findings of blood chemistry: Secondary | ICD-10-CM | POA: Insufficient documentation

## 2022-08-27 ENCOUNTER — Other Ambulatory Visit: Payer: Self-pay | Admitting: Nurse Practitioner

## 2022-08-27 ENCOUNTER — Inpatient Hospital Stay: Payer: 59

## 2022-08-27 ENCOUNTER — Inpatient Hospital Stay: Payer: 59 | Attending: Oncology | Admitting: Oncology

## 2022-08-27 ENCOUNTER — Encounter: Payer: Self-pay | Admitting: Oncology

## 2022-08-27 VITALS — BP 121/85 | HR 88 | Temp 97.3°F | Resp 16 | Ht 66.0 in | Wt 175.8 lb

## 2022-08-27 DIAGNOSIS — D72819 Decreased white blood cell count, unspecified: Secondary | ICD-10-CM

## 2022-08-27 DIAGNOSIS — R7989 Other specified abnormal findings of blood chemistry: Secondary | ICD-10-CM

## 2022-08-27 DIAGNOSIS — R7402 Elevation of levels of lactic acid dehydrogenase (LDH): Secondary | ICD-10-CM | POA: Insufficient documentation

## 2022-08-27 LAB — CBC WITH DIFFERENTIAL/PLATELET
Abs Immature Granulocytes: 0.01 10*3/uL (ref 0.00–0.07)
Basophils Absolute: 0 10*3/uL (ref 0.0–0.1)
Basophils Relative: 1 %
Eosinophils Absolute: 0.1 10*3/uL (ref 0.0–0.5)
Eosinophils Relative: 3 %
HCT: 37.8 % (ref 36.0–46.0)
Hemoglobin: 12.9 g/dL (ref 12.0–15.0)
Immature Granulocytes: 0 %
Lymphocytes Relative: 35 %
Lymphs Abs: 0.9 10*3/uL (ref 0.7–4.0)
MCH: 30.7 pg (ref 26.0–34.0)
MCHC: 34.1 g/dL (ref 30.0–36.0)
MCV: 90 fL (ref 80.0–100.0)
Monocytes Absolute: 0.2 10*3/uL (ref 0.1–1.0)
Monocytes Relative: 7 %
Neutro Abs: 1.5 10*3/uL — ABNORMAL LOW (ref 1.7–7.7)
Neutrophils Relative %: 54 %
Platelets: 196 10*3/uL (ref 150–400)
RBC: 4.2 MIL/uL (ref 3.87–5.11)
RDW: 12 % (ref 11.5–15.5)
WBC: 2.7 10*3/uL — ABNORMAL LOW (ref 4.0–10.5)
nRBC: 0 % (ref 0.0–0.2)

## 2022-08-27 LAB — IRON AND TIBC
Iron: 146 ug/dL (ref 28–170)
Saturation Ratios: 41 % — ABNORMAL HIGH (ref 10.4–31.8)
TIBC: 357 ug/dL (ref 250–450)
UIBC: 211 ug/dL

## 2022-08-27 LAB — FERRITIN: Ferritin: 318 ng/mL — ABNORMAL HIGH (ref 11–307)

## 2022-08-27 LAB — FOLATE: Folate: >40 ng/mL (ref >5.9)

## 2022-08-27 LAB — VITAMIN B12: Vitamin B-12: 655 pg/mL (ref 180–914)

## 2022-08-27 LAB — LACTATE DEHYDROGENASE: LDH: 254 U/L — ABNORMAL HIGH (ref 98–192)

## 2022-08-27 NOTE — Progress Notes (Signed)
[facility]  [phone] [phone]) [phone]  ID: Marcia Newman OB: 1966-08-12  MR#: [id number]  FGH#:[id number]  Patient Care Team: Michela Pitcher, NP as PCP - General  CHIEF COMPLAINT: Leukopenia.  INTERVAL HISTORY: Patient is a 56 year old female who was noted to have a mild leukopenia on routine blood work.  She currently feels well and is asymptomatic.  She has no neurologic complaints.  She denies any recent fevers or illnesses.  She has a good appetite and denies weight loss.  She has no chest pain, shortness of breath, cough, or hemoptysis.  She denies any nausea, vomiting, constipation, or diarrhea.  She has no urinary complaints.  Patient feels at her baseline offers no specific complaints today.  REVIEW OF SYSTEMS:   Review of Systems  Constitutional: Negative.  Negative for fever, malaise/fatigue and weight loss.  Respiratory: Negative.  Negative for cough, hemoptysis and shortness of breath.   Cardiovascular: Negative.  Negative for chest pain and leg swelling.  Gastrointestinal: Negative.  Negative for abdominal pain.  Genitourinary: Negative.  Negative for dysuria.  Musculoskeletal: Negative.  Negative for back pain.  Skin: Negative.  Negative for rash.  Neurological: Negative.  Negative for dizziness, focal weakness, weakness and headaches.  Psychiatric/Behavioral: Negative.  The patient is not nervous/anxious.     As per HPI. Otherwise, a complete review of systems is negative.  PAST MEDICAL HISTORY: Past Medical History:  Diagnosis Date   H/O LEEP    History of C-section    Macromastia    bil   SVD (spontaneous vaginal delivery)    x 4, 1 stillborn delivery    PAST SURGICAL HISTORY: Past Surgical History:  Procedure Laterality Date   BREAST REDUCTION SURGERY Bilateral 12/20/2019   Procedure: MAMMARY REDUCTION  (BREAST);  Surgeon: Cindra Presume, MD;  Location: [facility];  Service: Plastics;  Laterality:  Bilateral;   CESAREAN SECTION  2008   x 1   WISDOM TOOTH EXTRACTION      FAMILY HISTORY: Family History  Problem Relation Age of Onset   Dementia Mother    Hypertension Paternal Grandmother    Colon cancer Neg Hx    Rectal cancer Neg Hx    Stomach cancer Neg Hx    Breast cancer Neg Hx     ADVANCED DIRECTIVES (Y/N):  N  HEALTH MAINTENANCE: Social History   Tobacco Use   Smoking status: Never   Smokeless tobacco: Never  Vaping Use   Vaping Use: Never used  Substance Use Topics   Alcohol use: Yes    Comment: occasional   Drug use: Never     Colonoscopy:  PAP:  Bone density:  Lipid panel:  No Known Allergies  Current Outpatient Medications  Medication Sig Dispense Refill   Ascorbic Acid (VITAMIN C) 1000 MG tablet Take 1,000 mg by mouth daily.     Cholecalciferol (VITAMIN D-3) 125 MCG (5000 UT) TABS Take 1 tablet by mouth daily.     fluticasone (FLONASE) 50 MCG/ACT nasal spray Place 2 sprays into both nostrils daily. 16 g 0   levonorgestrel (MIRENA) 20 MCG/24HR IUD 1 each by Intrauterine route once.     Multiple Vitamin (MULTIVITAMIN WITH MINERALS) TABS tablet Take 1 tablet by mouth daily.     No current facility-administered medications for this visit.    OBJECTIVE: Vitals:   08/27/22 0954  BP: 121/85  Pulse: 88  Resp: 16  Temp: (!) 97.3 F (36.3 C)  SpO2: 100%  Body mass index is 28.37 kg/m.    ECOG FS:0 - Asymptomatic  General: Well-developed, well-nourished, no acute distress. Eyes: Pink conjunctiva, anicteric sclera. HEENT: Normocephalic, moist mucous membranes. Lungs: No audible wheezing or coughing. Heart: Regular rate and rhythm. Abdomen: Soft, nontender, no obvious distention. Musculoskeletal: No edema, cyanosis, or clubbing. Neuro: Alert, answering all questions appropriately. Cranial nerves grossly intact. Skin: No rashes or petechiae noted. Psych: Normal affect. Lymphatics: No cervical, calvicular, axillary or inguinal LAD.   LAB  RESULTS:  Lab Results  Component Value Date   NA 140 08/12/2022   K 4.3 08/12/2022   CL 104 08/12/2022   CO2 29 08/12/2022   GLUCOSE 85 08/12/2022   BUN 14 08/12/2022   CREATININE 0.99 08/12/2022   CALCIUM 9.3 08/12/2022   PROT 7.5 08/12/2022   ALBUMIN 4.2 08/12/2022   AST 71 (H) 08/12/2022   ALT 57 (H) 08/12/2022   ALKPHOS 60 08/12/2022   BILITOT 0.9 08/12/2022    Lab Results  Component Value Date   WBC 3.0 (L) 08/12/2022   HGB 12.2 08/12/2022   HCT 36.6 08/12/2022   MCV 92.5 08/12/2022   PLT 179.0 08/12/2022     STUDIES: US Abdomen Limited RUQ (LIVER/GB)  Result Date: 08/26/2022 CLINICAL DATA:  Elevated LFTs EXAM: ULTRASOUND ABDOMEN LIMITED RIGHT UPPER QUADRANT COMPARISON:  None Available. FINDINGS: Gallbladder: No gallstones or wall thickening visualized. No sonographic Murphy sign noted by sonographer. Common bile duct: Diameter: 5.6 mm Liver: No focal lesion identified. Within normal limits in parenchymal echogenicity. Portal vein is patent on color Doppler imaging with normal direction of blood flow towards the liver. Other: None. IMPRESSION: No cholelithiasis or sonographic evidence for acute cholecystitis. Electronically Signed   By: Lovey Newcomer M.D.   On: 08/26/2022 11:29    ASSESSMENT: Leukopenia.  PLAN:    Leukopenia: Upon review of patient's chart she has had a mildly decreased white blood cell count since at least September 2022.  Today's result was 2.7.  She has a mildly elevated LDH, but the remainder of her laboratory work is pending at time of dictation.  No intervention is needed at this time.  Patient does not require bone marrow biopsy.  Return to clinic in 2 weeks with video assisted telemedicine visit for further evaluation and discussion of her results.  I spent a total of 45 minutes reviewing chart data, face-to-face evaluation with the patient, counseling and coordination of care as detailed above.   Patient expressed understanding and was in  agreement with this plan. She also understands that She can call clinic at any time with any questions, concerns, or complaints.    Lloyd Huger, MD   08/27/2022 10:45 AM

## 2022-08-30 LAB — COMP PANEL: LEUKEMIA/LYMPHOMA

## 2022-09-05 LAB — NEUTROPHIL AB TEST LEVEL 1: NEUTROPHIL SCR/PANEL INTERP.: POSITIVE — AB

## 2022-09-10 ENCOUNTER — Inpatient Hospital Stay: Payer: 59 | Admitting: Oncology

## 2022-09-10 ENCOUNTER — Encounter: Payer: Self-pay | Admitting: Oncology

## 2022-09-10 DIAGNOSIS — D72819 Decreased white blood cell count, unspecified: Secondary | ICD-10-CM | POA: Insufficient documentation

## 2022-09-10 NOTE — Progress Notes (Signed)
  [facility]  [phone] [phone]) [phone]  ID: Marcia Newman OB: 08-Feb-1966  MR#: [id number]  HKU#:[id number]  Patient Care Team: Eden Emms, NP as PCP - General    Jeralyn Ruths, MD   09/10/2022 3:15 PM    This encounter was created in error - please disregard.

## 2022-09-10 NOTE — Progress Notes (Signed)
Pt voices no new concerns. Feeling at her baseline. MD to discuss lab results with her today.

## 2022-09-24 ENCOUNTER — Inpatient Hospital Stay: Payer: 59 | Admitting: Oncology

## 2022-09-24 ENCOUNTER — Encounter: Payer: Self-pay | Admitting: Oncology

## 2022-09-24 NOTE — Progress Notes (Signed)
This encounter was created in error - please disregard.

## 2022-10-13 IMAGING — DX DG LUMBAR SPINE COMPLETE 4+V
5 series · 5 of 5 positions shown · non-contrast
Comparison: None Available.

CLINICAL DATA: fell onto buttocks 4 days ago- r/o fracture

EXAM:
LUMBAR SPINE - COMPLETE 4+ VIEW

[lumbar spine ap]
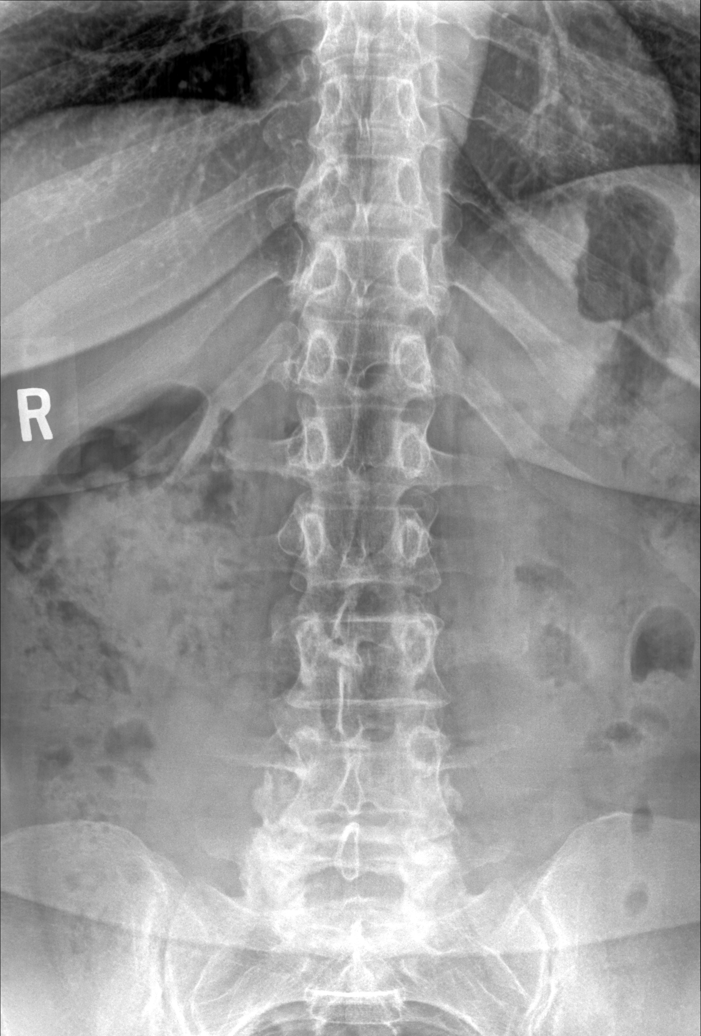

[lumbar spine lmo]
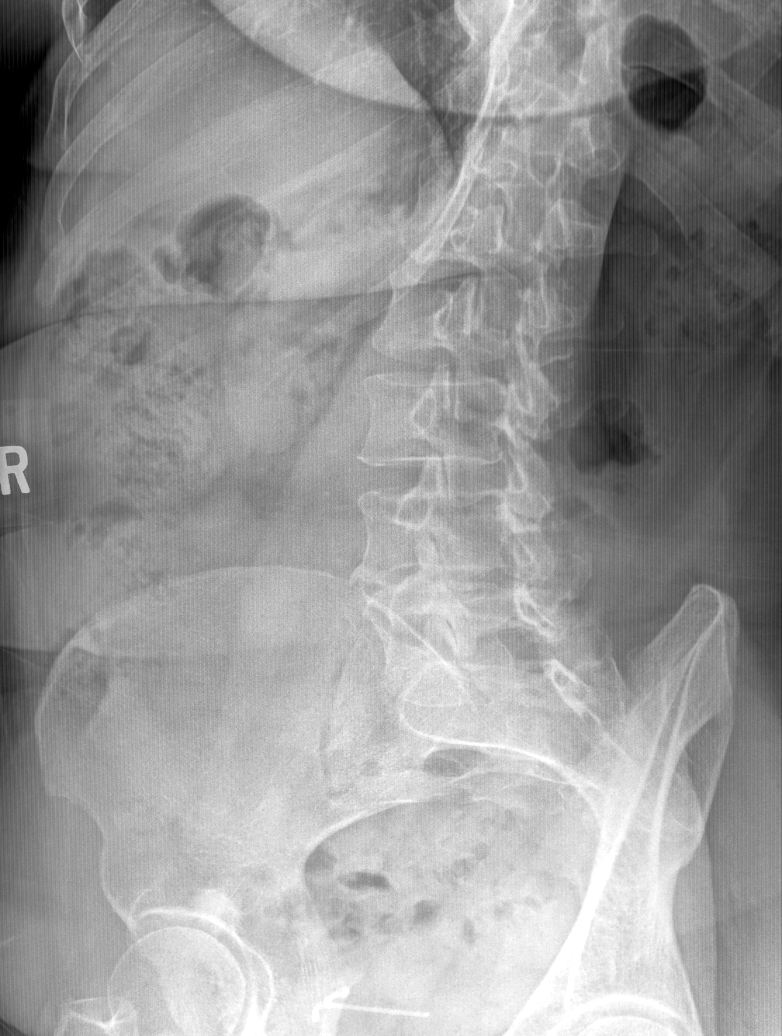

[lumbar spine mlo]
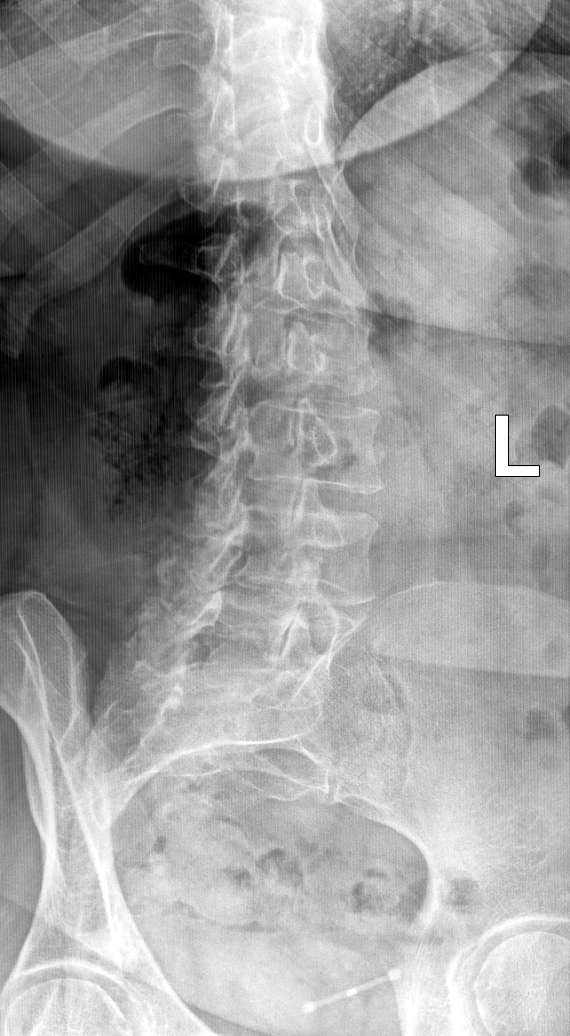

[lumbar spine lat]
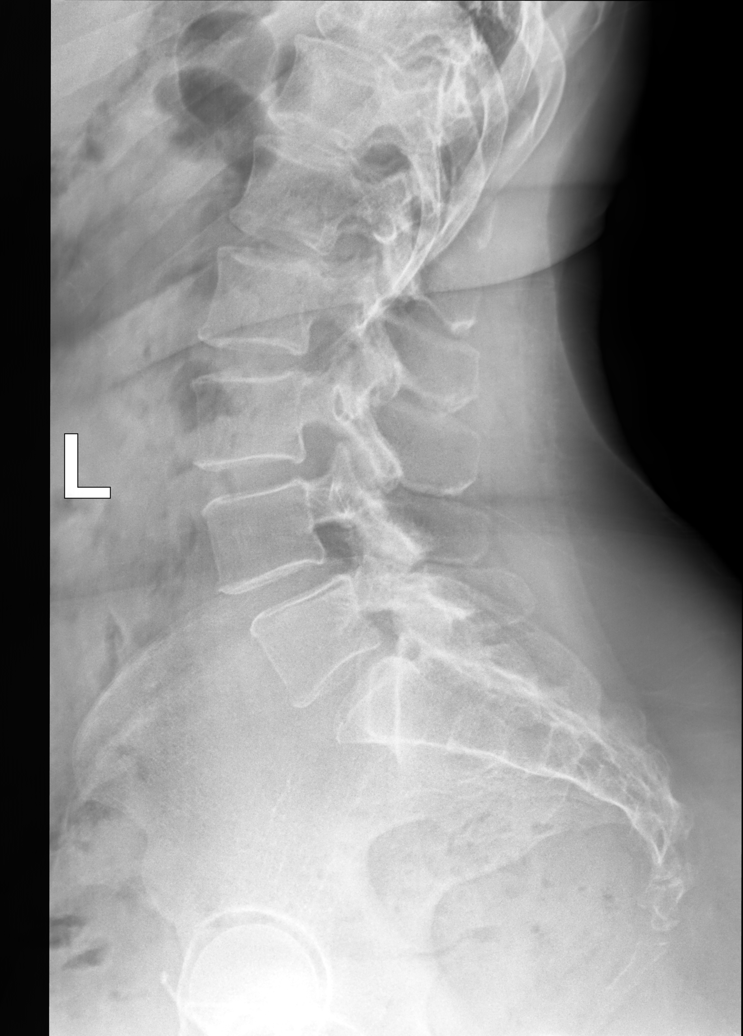

[l5-s1]
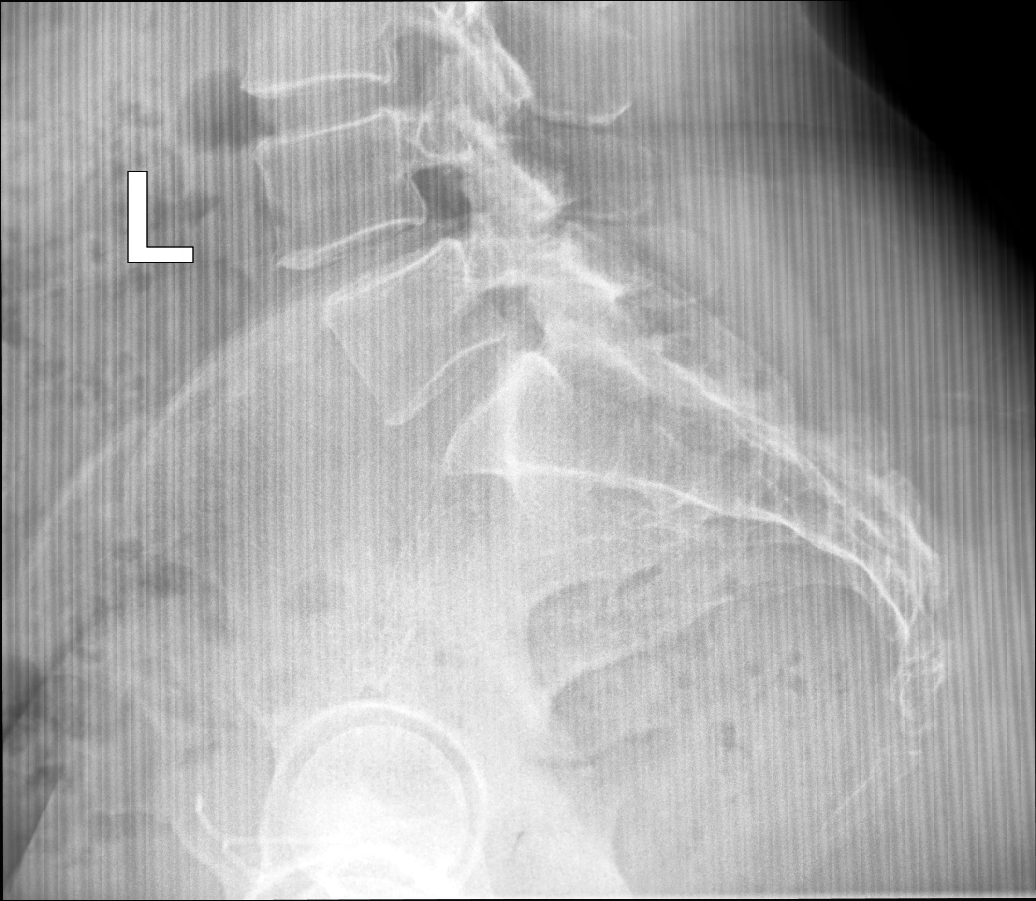

[5 of 5 positions shown; findings below may reference images not displayed]

FINDINGS: There are 5 non-rib-bearing lumbar vertebrae. There is no evidence
of lumbar spine fracture. There is mild degenerative disc disease at
L4-L5 with facet arthropathy and grade 1 anterolisthesis at this
level. Mild to moderate lower lumbar predominant facet arthropathy.
IMPRESSION: No evidence of lumbar spine fracture.

Mild degenerative disc disease at L4-L5 with grade 1
anterolisthesis.

Mild to moderate lower lumbar predominant facet arthropathy.

## 2022-10-29 ENCOUNTER — Other Ambulatory Visit: Payer: 59

## 2022-11-20 ENCOUNTER — Other Ambulatory Visit (INDEPENDENT_AMBULATORY_CARE_PROVIDER_SITE_OTHER): Payer: 59

## 2022-11-20 DIAGNOSIS — R7989 Other specified abnormal findings of blood chemistry: Secondary | ICD-10-CM

## 2022-11-20 LAB — HEPATIC FUNCTION PANEL
ALT: 55 U/L — ABNORMAL HIGH (ref 0–35)
AST: 63 U/L — ABNORMAL HIGH (ref 0–37)
Albumin: 4.1 g/dL (ref 3.5–5.2)
Alkaline Phosphatase: 59 U/L (ref 39–117)
Bilirubin, Direct: 0.3 mg/dL (ref 0.0–0.3)
Total Bilirubin: 0.9 mg/dL (ref 0.2–1.2)
Total Protein: 7.7 g/dL (ref 6.0–8.3)

## 2022-12-30 ENCOUNTER — Telehealth: Payer: Self-pay | Admitting: Nurse Practitioner

## 2022-12-30 ENCOUNTER — Other Ambulatory Visit: Payer: Self-pay | Admitting: Nurse Practitioner

## 2022-12-30 DIAGNOSIS — Z1231 Encounter for screening mammogram for malignant neoplasm of breast: Secondary | ICD-10-CM

## 2022-12-30 NOTE — Telephone Encounter (Signed)
Order placed. Can we have the patient to call and scheduled her mammogram

## 2022-12-30 NOTE — Telephone Encounter (Signed)
Patient called in and stated that she needs a referral to be sent in to [facility] in [place] in order to schedule her mammogram. Thank you!

## 2022-12-30 NOTE — Addendum Note (Signed)
Addended by: Michela Pitcher on: 12/30/2022 04:56 PM   Modules accepted: Orders

## 2023-01-02 NOTE — Telephone Encounter (Signed)
Called and informed pt of this information. I also gave her the number to call and schedule.

## 2023-01-28 ENCOUNTER — Ambulatory Visit
Admission: RE | Admit: 2023-01-28 | Discharge: 2023-01-28 | Disposition: A | Discharge status: Home / Self Care | Payer: 59 | Source: Ambulatory Visit | Attending: Nurse Practitioner | Admitting: Nurse Practitioner

## 2023-01-28 DIAGNOSIS — Z1231 Encounter for screening mammogram for malignant neoplasm of breast: Secondary | ICD-10-CM

## 2023-12-30 ENCOUNTER — Encounter: Payer: Self-pay | Admitting: Nurse Practitioner

## 2023-12-30 ENCOUNTER — Other Ambulatory Visit: Payer: Self-pay | Admitting: Nurse Practitioner

## 2023-12-30 DIAGNOSIS — Z1231 Encounter for screening mammogram for malignant neoplasm of breast: Secondary | ICD-10-CM

## 2024-01-30 ENCOUNTER — Ambulatory Visit
Admission: RE | Admit: 2024-01-30 | Discharge: 2024-01-30 | Disposition: A | Discharge status: Home / Self Care | Source: Ambulatory Visit | Attending: Nurse Practitioner

## 2024-01-30 DIAGNOSIS — Z1231 Encounter for screening mammogram for malignant neoplasm of breast: Secondary | ICD-10-CM | POA: Diagnosis present

## 2024-02-04 ENCOUNTER — Encounter: Payer: Self-pay | Admitting: Nurse Practitioner
